# Patient Record
Sex: Female | Born: 1975
Health system: Southern US, Community
[De-identification: ages and names within clinical notes are randomized; demographics above are authoritative.]

## PROBLEM LIST (undated history)

## (undated) DIAGNOSIS — F32A Depression, unspecified: Secondary | ICD-10-CM

## (undated) DIAGNOSIS — R911 Solitary pulmonary nodule: Secondary | ICD-10-CM

## (undated) DIAGNOSIS — E669 Obesity, unspecified: Secondary | ICD-10-CM

## (undated) DIAGNOSIS — K635 Polyp of colon: Secondary | ICD-10-CM

## (undated) DIAGNOSIS — B9681 Helicobacter pylori [H. pylori] as the cause of diseases classified elsewhere: Secondary | ICD-10-CM

## (undated) DIAGNOSIS — K921 Melena: Secondary | ICD-10-CM

## (undated) DIAGNOSIS — K2970 Gastritis, unspecified, without bleeding: Secondary | ICD-10-CM

## (undated) DIAGNOSIS — R011 Cardiac murmur, unspecified: Secondary | ICD-10-CM

## (undated) DIAGNOSIS — F329 Major depressive disorder, single episode, unspecified: Secondary | ICD-10-CM

## (undated) DIAGNOSIS — R590 Localized enlarged lymph nodes: Secondary | ICD-10-CM

## (undated) DIAGNOSIS — K760 Fatty (change of) liver, not elsewhere classified: Secondary | ICD-10-CM

## (undated) DIAGNOSIS — D473 Essential (hemorrhagic) thrombocythemia: Secondary | ICD-10-CM

## (undated) DIAGNOSIS — D539 Nutritional anemia, unspecified: Secondary | ICD-10-CM

## (undated) DIAGNOSIS — F419 Anxiety disorder, unspecified: Secondary | ICD-10-CM

## (undated) DIAGNOSIS — M1990 Unspecified osteoarthritis, unspecified site: Secondary | ICD-10-CM

## (undated) DIAGNOSIS — E876 Hypokalemia: Principal | ICD-10-CM

## (undated) DIAGNOSIS — I10 Essential (primary) hypertension: Secondary | ICD-10-CM

## (undated) HISTORY — DX: Nutritional anemia, unspecified: D53.9

## (undated) HISTORY — PX: BREAST BIOPSY: SHX20

## (undated) HISTORY — DX: Anxiety disorder, unspecified: F41.9

## (undated) HISTORY — PX: REDUCTION MAMMAPLASTY: SUR839

## (undated) HISTORY — DX: Essential (primary) hypertension: I10

## (undated) HISTORY — DX: Melena: K92.1

## (undated) HISTORY — PX: KNEE ARTHROSCOPY: SUR90

## (undated) HISTORY — DX: Essential (hemorrhagic) thrombocythemia: D47.3

## (undated) HISTORY — DX: Fatty (change of) liver, not elsewhere classified: K76.0

## (undated) HISTORY — DX: Localized enlarged lymph nodes: R59.0

## (undated) HISTORY — DX: Cardiac murmur, unspecified: R01.1

## (undated) HISTORY — DX: Helicobacter pylori (H. pylori) as the cause of diseases classified elsewhere: B96.81

## (undated) HISTORY — DX: Polyp of colon: K63.5

## (undated) HISTORY — DX: Unspecified osteoarthritis, unspecified site: M19.90

## (undated) HISTORY — DX: Depression, unspecified: F32.A

## (undated) HISTORY — DX: Major depressive disorder, single episode, unspecified: F32.9

## (undated) HISTORY — DX: Solitary pulmonary nodule: R91.1

## (undated) HISTORY — DX: Gastritis, unspecified, without bleeding: K29.70

## (undated) HISTORY — DX: Hypokalemia: E87.6

## (undated) HISTORY — DX: Obesity, unspecified: E66.9

---

## 2002-03-27 ENCOUNTER — Encounter: Payer: Self-pay | Admitting: Emergency Medicine

## 2002-03-27 ENCOUNTER — Emergency Department (HOSPITAL_COMMUNITY): Admission: EM | Admit: 2002-03-27 | Discharge: 2002-03-27 | Payer: Self-pay | Admitting: Emergency Medicine

## 2008-10-19 HISTORY — PX: BREAST REDUCTION SURGERY: SHX8

## 2009-10-19 ENCOUNTER — Encounter (INDEPENDENT_AMBULATORY_CARE_PROVIDER_SITE_OTHER): Payer: Self-pay | Admitting: Plastic Surgery

## 2009-10-19 ENCOUNTER — Ambulatory Visit (HOSPITAL_BASED_OUTPATIENT_CLINIC_OR_DEPARTMENT_OTHER): Admission: RE | Admit: 2009-10-19 | Discharge: 2009-10-20 | Payer: Self-pay | Admitting: Plastic Surgery

## 2010-08-20 ENCOUNTER — Ambulatory Visit: Payer: Self-pay | Admitting: Diagnostic Radiology

## 2010-08-20 ENCOUNTER — Ambulatory Visit (HOSPITAL_BASED_OUTPATIENT_CLINIC_OR_DEPARTMENT_OTHER): Admission: RE | Admit: 2010-08-20 | Discharge: 2010-08-20 | Payer: Self-pay | Admitting: Orthopedic Surgery

## 2011-03-08 LAB — POCT HEMOGLOBIN-HEMACUE: Hemoglobin: 10.7 g/dL — ABNORMAL LOW (ref 12.0–15.0)

## 2012-02-24 HISTORY — PX: KNEE SURGERY: SHX244

## 2012-08-22 ENCOUNTER — Ambulatory Visit (INDEPENDENT_AMBULATORY_CARE_PROVIDER_SITE_OTHER): Payer: BC Managed Care – PPO | Admitting: Family Medicine

## 2012-08-22 ENCOUNTER — Encounter: Payer: Self-pay | Admitting: Family Medicine

## 2012-08-22 ENCOUNTER — Other Ambulatory Visit (HOSPITAL_COMMUNITY)
Admission: RE | Admit: 2012-08-22 | Discharge: 2012-08-22 | Disposition: A | Discharge status: Home / Self Care | Payer: BC Managed Care – PPO | Source: Ambulatory Visit | Attending: Family Medicine | Admitting: Family Medicine

## 2012-08-22 VITALS — BP 130/84 | HR 85 | Temp 98.4°F | Ht 64.5 in | Wt 225.2 lb

## 2012-08-22 DIAGNOSIS — Z0000 Encounter for general adult medical examination without abnormal findings: Secondary | ICD-10-CM | POA: Insufficient documentation

## 2012-08-22 DIAGNOSIS — Z01419 Encounter for gynecological examination (general) (routine) without abnormal findings: Secondary | ICD-10-CM | POA: Insufficient documentation

## 2012-08-22 DIAGNOSIS — Z124 Encounter for screening for malignant neoplasm of cervix: Secondary | ICD-10-CM | POA: Insufficient documentation

## 2012-08-22 DIAGNOSIS — K625 Hemorrhage of anus and rectum: Secondary | ICD-10-CM | POA: Insufficient documentation

## 2012-08-22 LAB — CBC WITH DIFFERENTIAL/PLATELET
Basophils Absolute: 0 10*3/uL (ref 0.0–0.1)
Basophils Relative: 0.3 % (ref 0.0–3.0)
Eosinophils Absolute: 0.1 10*3/uL (ref 0.0–0.7)
Eosinophils Relative: 1 % (ref 0.0–5.0)
HCT: 37 % (ref 36.0–46.0)
Hemoglobin: 11.7 g/dL — ABNORMAL LOW (ref 12.0–15.0)
Lymphocytes Relative: 20.4 % (ref 12.0–46.0)
Lymphs Abs: 1.9 10*3/uL (ref 0.7–4.0)
MCHC: 31.5 g/dL (ref 30.0–36.0)
MCV: 88.3 fl (ref 78.0–100.0)
Monocytes Absolute: 0.7 10*3/uL (ref 0.1–1.0)
Monocytes Relative: 7.6 % (ref 3.0–12.0)
Neutro Abs: 6.4 10*3/uL (ref 1.4–7.7)
Neutrophils Relative %: 70.7 % (ref 43.0–77.0)
Platelets: 398 10*3/uL (ref 150.0–400.0)
RBC: 4.19 Mil/uL (ref 3.87–5.11)
RDW: 12.4 % (ref 11.5–14.6)
WBC: 9.1 10*3/uL (ref 4.5–10.5)

## 2012-08-22 LAB — BASIC METABOLIC PANEL
BUN: 15 mg/dL (ref 6–23)
CO2: 26 mEq/L (ref 19–32)
Calcium: 8.9 mg/dL (ref 8.4–10.5)
Chloride: 104 mEq/L (ref 96–112)
Creatinine, Ser: 1.1 mg/dL (ref 0.4–1.2)
GFR: 74.66 mL/min (ref >60.00)
Glucose, Bld: 94 mg/dL (ref 70–99)
Potassium: 3.4 mEq/L — ABNORMAL LOW (ref 3.5–5.1)
Sodium: 137 mEq/L (ref 135–145)

## 2012-08-22 LAB — HEPATIC FUNCTION PANEL
ALT: 44 U/L — ABNORMAL HIGH (ref 0–35)
AST: 34 U/L (ref 0–37)
Albumin: 3.9 g/dL (ref 3.5–5.2)
Alkaline Phosphatase: 92 U/L (ref 39–117)
Bilirubin, Direct: 0 mg/dL (ref 0.0–0.3)
Total Bilirubin: 0.7 mg/dL (ref 0.3–1.2)
Total Protein: 7.7 g/dL (ref 6.0–8.3)

## 2012-08-22 LAB — LIPID PANEL
Total CHOL/HDL Ratio: 2
VLDL: 21 mg/dL (ref 0.0–40.0)

## 2012-08-22 LAB — TSH: TSH: 1.14 u[IU]/mL (ref 0.35–5.50)

## 2012-08-22 NOTE — Patient Instructions (Addendum)
We'll notify you of your lab results and make any changes if needed We'll call you with your GI appt to assess your rectal bleeding- it's most likely a diverticular bleed Try and make healthy food choices and get regular exercise Call with any questions or concerns Welcome!  We're glad to have you! Happy Early [place] Ouch!!

## 2012-08-22 NOTE — Progress Notes (Signed)
  Subjective:    Patient ID: Pamela Clarke, female    DOB: 01-15-1976, 36 y.o.   MRN: [id number]  HPI New to establish.  Previous MD- Raymondo Band, last seen 3-4 yrs ago.  CPE- no concerns today, no medical problems.   Review of Systems Patient reports no vision/ hearing changes, adenopathy, fever, weight change,  persistant/recurrent hoarseness , swallowing issues, chest pain, palpitations, edema, persistant/recurrent cough, hemoptysis, dyspnea (rest/exertional/paroxysmal nocturnal), abdominal pain, significant heartburn, bowel changes, GU symptoms (dysuria, hematuria, incontinence), Gyn symptoms (abnormal  bleeding, pain),  syncope, focal weakness, memory loss, numbness & tingling, skin/hair/nail changes, abnormal bruising or bleeding, anxiety, or depression.   2 episodes recently of BRBPR- fills the bowl after BM.  Both episodes occurred in last month.  No hx of previous.  Denies recent constipation.  No family hx of colon cancer.    Objective:   Physical Exam  General Appearance:    Alert, cooperative, no distress, appears stated age  Head:    Normocephalic, without obvious abnormality, atraumatic  Eyes:    PERRL, conjunctiva/corneas clear, EOM's intact, fundi    benign, both eyes  Ears:    Normal TM's and external ear canals, both ears  Nose:   Nares normal, septum midline, mucosa normal, no drainage    or sinus tenderness  Throat:   Lips, mucosa, and tongue normal; teeth and gums normal  Neck:   Supple, symmetrical, trachea midline, no adenopathy;    Thyroid: no enlargement/tenderness/nodules  Back:     Symmetric, no curvature, ROM normal, no CVA tenderness  Lungs:     Clear to auscultation bilaterally, respirations unlabored  Chest Wall:    No tenderness or deformity   Heart:    Regular rate and rhythm, S1 and S2 normal, no murmur, rub   or gallop  Breast Exam:    No tenderness, masses, or nipple abnormality  Abdomen:     Soft, non-tender, bowel sounds active all four quadrants,     no masses, no organomegaly  Genitalia:    External genitalia normal, cervix normal in appearance, no CMT, uterus in normal size and position, adnexa w/out mass or tenderness, mucosa pink and moist, no lesions or discharge present  Rectal:    Normal external appearance  Extremities:   Extremities normal, atraumatic, no cyanosis or edema  Pulses:   2+ and symmetric all extremities  Skin:   Skin color, texture, turgor normal, no rashes or lesions  Lymph nodes:   Cervical, supraclavicular, and axillary nodes normal  Neurologic:   CNII-XII intact, normal strength, sensation and reflexes    throughout          Assessment & Plan:

## 2012-08-23 ENCOUNTER — Encounter: Payer: Self-pay | Admitting: Internal Medicine

## 2012-08-25 LAB — VITAMIN D 1,25 DIHYDROXY
Vitamin D 1, 25 (OH)2 Total: 39 pg/mL (ref 18–72)
Vitamin D3 1, 25 (OH)2: 39 pg/mL

## 2012-08-26 ENCOUNTER — Encounter: Payer: Self-pay | Admitting: *Deleted

## 2012-08-27 NOTE — Assessment & Plan Note (Signed)
Pap collected. 

## 2012-08-27 NOTE — Assessment & Plan Note (Signed)
New.  Most likely a diverticular bleed or anal fissure but will refer to GI for complete evaluation.

## 2012-08-27 NOTE — Assessment & Plan Note (Signed)
Pt's PE WNL.  Check labs.  Anticipatory guidance provided.  

## 2012-09-03 ENCOUNTER — Encounter: Payer: Self-pay | Admitting: Internal Medicine

## 2012-09-03 ENCOUNTER — Other Ambulatory Visit (INDEPENDENT_AMBULATORY_CARE_PROVIDER_SITE_OTHER): Payer: BC Managed Care – PPO

## 2012-09-03 ENCOUNTER — Ambulatory Visit (INDEPENDENT_AMBULATORY_CARE_PROVIDER_SITE_OTHER): Payer: BC Managed Care – PPO | Admitting: Internal Medicine

## 2012-09-03 VITALS — BP 160/98 | HR 72 | Ht 63.5 in | Wt 228.2 lb

## 2012-09-03 DIAGNOSIS — K625 Hemorrhage of anus and rectum: Secondary | ICD-10-CM

## 2012-09-03 DIAGNOSIS — R7989 Other specified abnormal findings of blood chemistry: Secondary | ICD-10-CM

## 2012-09-03 DIAGNOSIS — R945 Abnormal results of liver function studies: Secondary | ICD-10-CM

## 2012-09-03 LAB — FERRITIN: Ferritin: 49.1 ng/mL (ref 10.0–291.0)

## 2012-09-03 LAB — HEPATITIS C ANTIBODY: HCV Ab: NEGATIVE

## 2012-09-03 MED ORDER — MOVIPREP 100 G PO SOLR: 1.0000 | Freq: Once | ORAL | Status: DC

## 2012-09-03 NOTE — Progress Notes (Signed)
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 She has been having painless, low-volume rectal bleeding for at least a year. The blood is on the toilet tissue as well as in the commode. She has chronic anemia. Her hemoglobin has been as low as 10.7, most recently it was 11.7 with an MCV of 88. There is no family history of colon cancer. Her bowel habits are regular, 1-2 bowel movements a day. Another issue has been mild abnormalities of liver function tests with an AST of 34 and ALT of 44. She was told to have abnormal liver tests while in college 15 years ago. She was never tested for liver disease. There is no family history of liver disease. She does not drink alcohol. She played college basketball and had right knee injuries resulting from it necessitating recent arthroscopy. She denies any abdominal pain, nausea, vomiting or family history of gallbladder disease.   Past Medical History  Diagnosis Date  . Arthritis   . Blood in stool   . Heart murmur   . Obesity    Past Surgical History  Procedure Date  . Knee surgery 3 23 13     right  . Knee arthroscopy     left  . Breast reduction surgery 10/19/08    reports that she has never smoked. She has never used smokeless tobacco. She reports that she drinks alcohol. She reports that she does not use illicit drugs. family history includes Diabetes in her father; Heart disease in her maternal grandmother and paternal grandfather; Hypertension in her father and maternal grandmother; Ovarian cancer in her paternal grandmother; and Stroke in her father, maternal grandmother, and paternal grandfather.  There is no history of Colon cancer. No Known Allergies      Review of Systems: Denies heartburn shortness of breath chest pain. Positive for weight gain  The remainder of the 10 point ROS is negative except as outlined in H&P   Physical  Exam: General appearance  Well developed, in no distress. Eyes- non icteric. HEENT nontraumatic, normocephalic. Mouth no lesions, tongue papillated, no cheilosis. Neck supple without adenopathy, thyroid not enlarged, no carotid bruits, no JVD. Lungs Clear to auscultation bilaterally. Cor normal S1, normal S2, regular rhythm, no murmur,  quiet precordium. Abdomen: Soft with mild tenderness in epigastrium. Mild tenderness in left lower quadrant. No distention. Normal active bowel sounds. Liver edge at costal margin. Nontender not enlarged. Rectal: Soft Hemoccult negative stool. No external hemorrhoids no pain on digital exam. Extremities trace pedal edema. Skin no lesions. No stigmata of chronic liver disease. Neurological alert and oriented x 3. No asterixis Psychological normal mood and affect.  Assessment and Plan:  Problem #22 36 year old [place] American female with painless low-volume rectal bleeding of prolonged duration. She is concerned about the etiology of it. I suspect anorectal or left colon source because of the bright red blood. She has mild chronic anemia. We will proceed with a colonoscopy to rule out a left colon lesion. I have discussed the prep, sedation and the procedure with the patient.  Problem #2 Abnormal liver enzymes date back to 15 years ago. We need to rule out chronic low-grade liver disease. We will obtain multiple tests pertaining to the etiology of liver disease such as hepatitis serologies, AMA and ANA titer. PT. Anti SM antibody,,ferritin.. We will at the same time schedule an ultrasound of the liver and gallbladder.   09/03/2012  Lina Sar

## 2012-09-03 NOTE — Patient Instructions (Addendum)
You have been scheduled for a colonoscopy with propofol. Please follow written instructions given to you at your visit today.  Please pick up your prep kit at the pharmacy within the next 1-3 days. If you use inhalers (even only as needed), please bring them with you on the day of your procedure. You have been scheduled for an abdominal ultrasound at [facility] (1st floor of hospital) on Friday, 09/06/12 at 9:00 am. Please arrive 15 minutes prior to your appointment for registration. Make certain not to have anything to eat or drink 6 hours prior to your appointment. Should you need to reschedule your appointment, please contact radiology at [phone]. Your physician has requested that you go to the basement for the following lab work before leaving today: Hepatitis Panel CC: Dr Beverely Low

## 2012-09-04 LAB — ALPHA-1-ANTITRYPSIN: A-1 Antitrypsin, Ser: 136 mg/dL (ref 90–200)

## 2012-09-04 LAB — ANA: Anti Nuclear Antibody(ANA): NEGATIVE

## 2012-09-05 ENCOUNTER — Encounter: Payer: Self-pay | Admitting: *Deleted

## 2012-09-05 ENCOUNTER — Other Ambulatory Visit (INDEPENDENT_AMBULATORY_CARE_PROVIDER_SITE_OTHER): Payer: BC Managed Care – PPO

## 2012-09-05 DIAGNOSIS — R7401 Elevation of levels of liver transaminase levels: Secondary | ICD-10-CM

## 2012-09-05 DIAGNOSIS — R748 Abnormal levels of other serum enzymes: Secondary | ICD-10-CM

## 2012-09-05 DIAGNOSIS — R7402 Elevation of levels of lactic acid dehydrogenase (LDH): Secondary | ICD-10-CM

## 2012-09-05 LAB — ANTI-SMOOTH MUSCLE ANTIBODY, IGG: Smooth Muscle Ab: 7 U (ref <20)

## 2012-09-05 LAB — HEPATIC FUNCTION PANEL
ALT: 22 U/L (ref 0–35)
AST: 25 U/L (ref 0–37)
Bilirubin, Direct: 0 mg/dL (ref 0.0–0.3)
Total Protein: 7.3 g/dL (ref 6.0–8.3)

## 2012-09-05 LAB — MITOCHONDRIAL ANTIBODIES: Mitochondrial M2 Ab, IgG: 0.51 (ref <0.91)

## 2012-09-06 ENCOUNTER — Ambulatory Visit (HOSPITAL_COMMUNITY)
Admission: RE | Admit: 2012-09-06 | Discharge: 2012-09-06 | Disposition: A | Discharge status: Home / Self Care | Payer: BC Managed Care – PPO | Source: Ambulatory Visit | Attending: Internal Medicine | Admitting: Internal Medicine

## 2012-09-06 ENCOUNTER — Other Ambulatory Visit: Payer: Self-pay | Admitting: *Deleted

## 2012-09-06 DIAGNOSIS — K760 Fatty (change of) liver, not elsewhere classified: Secondary | ICD-10-CM

## 2012-09-06 DIAGNOSIS — R945 Abnormal results of liver function studies: Secondary | ICD-10-CM

## 2012-09-06 DIAGNOSIS — R7989 Other specified abnormal findings of blood chemistry: Secondary | ICD-10-CM | POA: Insufficient documentation

## 2012-10-22 ENCOUNTER — Encounter: Payer: Self-pay | Admitting: Internal Medicine

## 2012-10-22 ENCOUNTER — Ambulatory Visit (AMBULATORY_SURGERY_CENTER): Payer: BC Managed Care – PPO | Admitting: Internal Medicine

## 2012-10-22 VITALS — BP 159/91 | HR 61 | Temp 98.4°F | Resp 17 | Ht 63.5 in | Wt 228.0 lb

## 2012-10-22 DIAGNOSIS — K625 Hemorrhage of anus and rectum: Secondary | ICD-10-CM

## 2012-10-22 DIAGNOSIS — D126 Benign neoplasm of colon, unspecified: Secondary | ICD-10-CM

## 2012-10-22 DIAGNOSIS — K635 Polyp of colon: Secondary | ICD-10-CM

## 2012-10-22 DIAGNOSIS — R7989 Other specified abnormal findings of blood chemistry: Secondary | ICD-10-CM

## 2012-10-22 HISTORY — DX: Polyp of colon: K63.5

## 2012-10-22 MED ORDER — HYDROCORTISONE ACETATE 25 MG RE SUPP: 25.0000 mg | Freq: Two times a day (BID) | RECTAL | Status: DC

## 2012-10-22 MED ORDER — SODIUM CHLORIDE 0.9 % IV SOLN: 500.0000 mL | INTRAVENOUS | Status: DC

## 2012-10-22 NOTE — Op Note (Signed)
[facility] [address] [place], [address]   COLONOSCOPY PROCEDURE REPORT  PATIENT: Pamela, Clarke  MR#: [id number] BIRTHDATE: [birth year] , 36  yrs. old GENDER: Female ENDOSCOPIST: Hart Carwin, MD REFERRED BY:  Sheliah Hatch, M.D. PROCEDURE DATE:  10/22/2012 PROCEDURE:   Colonoscopy with cold biopsy polypectomy ASA CLASS:   Class I INDICATIONS:hematochezia. MEDICATIONS: MAC sedation, administered by CRNA and propofol (Diprivan) 350mg  IV  DESCRIPTION OF PROCEDURE:   After the risks and benefits and of the procedure were explained, informed consent was obtained.  A digital rectal exam revealed no abnormalities of the rectum.    The LB CF-[id number]  endoscope was introduced through the anus and advanced to the cecum, which was identified by both the appendix and ileocecal valve .  The quality of the prep was good, using MoviPrep .  The instrument was then slowly withdrawn as the colon was fully examined.     COLON FINDINGS: A smooth sessile polyp ranging between 3-49mm in size was found in the sigmoid colon.  A polypectomy was performed with cold forceps.  The resection was complete and the polyp tissue was completely retrieved.     Retroflexed views revealed no abnormalities.     The scope was then withdrawn from the patient and the procedure completed.  COMPLICATIONS: There were no complications. ENDOSCOPIC IMPRESSION: Sessile polyp ranging between 3-50mm in size was found in the sigmoid colon; polypectomy was performed with cold forceps anorectal source of bleeding, no significant hemiorrhoids   RECOMMENDATIONS: 1.  Await pathology results 2.  High fiber diet   REPEAT EXAM: In 10 year(s)  for Colonoscopy.  cc:  _______________________________ eSignedHart Carwin, MD 10/22/2012 4:10 PM     PATIENT NAME:  Pamela Clarke, Pamela Clarke MR#: [id number]

## 2012-10-22 NOTE — Progress Notes (Addendum)
Patient did not have preoperative order for IV antibiotic SSI prophylaxis. (G8918)  Patient did not experience any of the following events: a burn prior to discharge; a fall within the facility; wrong site/side/patient/procedure/implant event; or a hospital transfer or hospital admission upon discharge from the facility. (G8907)  

## 2012-10-22 NOTE — Patient Instructions (Addendum)
YOU HAD AN ENDOSCOPIC PROCEDURE TODAY AT THE [facility]: Refer to the procedure report that was given to you for any specific questions about what was found during the examination.  If the procedure report does not answer your questions, please call your gastroenterologist to clarify.  If you requested that your care partner not be given the details of your procedure findings, then the procedure report has been included in a sealed envelope for you to review at your convenience later.  YOU SHOULD EXPECT: Some feelings of bloating in the abdomen. Passage of more gas than usual.  Walking can help get rid of the air that was put into your GI tract during the procedure and reduce the bloating. If you had a lower endoscopy (such as a colonoscopy or flexible sigmoidoscopy) you may notice spotting of blood in your stool or on the toilet paper. If you underwent a bowel prep for your procedure, then you may not have a normal bowel movement for a few days.  DIET: Your first meal following the procedure should be a light meal and then it is ok to progress to your normal diet.  A half-sandwich or bowl of soup is an example of a good first meal.  Heavy or fried foods are harder to digest and may make you feel nauseous or bloated.  Likewise meals heavy in dairy and vegetables can cause extra gas to form and this can also increase the bloating.  Drink plenty of fluids but you should avoid alcoholic beverages for 24 hours.  ACTIVITY: Your care partner should take you home directly after the procedure.  You should plan to take it easy, moving slowly for the rest of the day.  You can resume normal activity the day after the procedure however you should NOT DRIVE or use heavy machinery for 24 hours (because of the sedation medicines used during the test).    SYMPTOMS TO REPORT IMMEDIATELY: A gastroenterologist can be reached at any hour.  During normal business hours, 8:30 AM to 5:00 PM Monday through Friday,  call [phone].  After hours and on weekends, please call the GI answering service at [phone] who will take a message and have the physician on call contact you.   Following lower endoscopy (colonoscopy or flexible sigmoidoscopy):  Excessive amounts of blood in the stool  Significant tenderness or worsening of abdominal pains  Swelling of the abdomen that is new, acute  Fever of 100F or higher  FOLLOW UP: If any biopsies were taken you will be contacted by phone or by letter within the next 1-3 weeks.  Call your gastroenterologist if you have not heard about the biopsies in 3 weeks.  Our staff will call the home number listed on your records the next business day following your procedure to check on you and address any questions or concerns that you may have at that time regarding the information given to you following your procedure. This is a courtesy call and so if there is no answer at the home number and we have not heard from you through the emergency physician on call, we will assume that you have returned to your regular daily activities without incident.  SIGNATURES/CONFIDENTIALITY: You and/or your care partner have signed paperwork which will be entered into your electronic medical record.  These signatures attest to the fact that that the information above on your After Visit Summary has been reviewed and is understood.  Full responsibility of the confidentiality of this   discharge information lies with you and/or your care-partner.   Thank-you for choosing us for your healthcare needs. 

## 2012-10-22 NOTE — Progress Notes (Signed)
No egg or soy allergy per pt. ewm 

## 2012-10-23 ENCOUNTER — Telehealth: Payer: Self-pay

## 2012-10-23 NOTE — Telephone Encounter (Signed)
  Follow up Call-  Call back number 10/22/2012  Post procedure Call Back phone  # [phone]  Permission to leave phone message Yes     Patient questions:  Do you have a fever, pain , or abdominal swelling? no Pain Score  0 *  Have you tolerated food without any problems? yes  Have you been able to return to your normal activities? yes  Do you have any questions about your discharge instructions: Diet   no Medications  no Follow up visit  no  Do you have questions or concerns about your Care? no  Actions: * If pain score is 4 or above: No action needed, pain <4.

## 2012-10-29 ENCOUNTER — Encounter: Payer: Self-pay | Admitting: Internal Medicine

## 2012-11-05 ENCOUNTER — Telehealth: Payer: Self-pay | Admitting: *Deleted

## 2012-11-05 ENCOUNTER — Other Ambulatory Visit: Payer: Self-pay | Admitting: *Deleted

## 2012-11-05 DIAGNOSIS — R945 Abnormal results of liver function studies: Secondary | ICD-10-CM

## 2012-11-05 NOTE — Telephone Encounter (Signed)
Spoke with patient and scheduled OV on 12/14/11 at 2:15 PM. Patient will come prior to OV for LFT's to be drawn.

## 2012-11-05 NOTE — Telephone Encounter (Signed)
Left a message for patient to call me. 

## 2012-11-05 NOTE — Telephone Encounter (Signed)
Message copied by Daphine Deutscher on Tue Nov 05, 2012  9:58 AM ------      Message from: Daphine Deutscher      Created: Fri Sep 06, 2012  3:28 PM       Call and schedule OV and LFT around 1/6/14DB

## 2012-11-11 ENCOUNTER — Encounter: Payer: Self-pay | Admitting: *Deleted

## 2012-12-01 ENCOUNTER — Emergency Department (HOSPITAL_BASED_OUTPATIENT_CLINIC_OR_DEPARTMENT_OTHER): Payer: BC Managed Care – PPO

## 2012-12-01 ENCOUNTER — Emergency Department (HOSPITAL_BASED_OUTPATIENT_CLINIC_OR_DEPARTMENT_OTHER)
Admission: EM | Admit: 2012-12-01 | Discharge: 2012-12-01 | Disposition: A | Discharge status: Home / Self Care | Payer: BC Managed Care – PPO | Attending: Emergency Medicine | Admitting: Emergency Medicine

## 2012-12-01 ENCOUNTER — Encounter (HOSPITAL_BASED_OUTPATIENT_CLINIC_OR_DEPARTMENT_OTHER): Payer: Self-pay | Admitting: *Deleted

## 2012-12-01 DIAGNOSIS — Z79899 Other long term (current) drug therapy: Secondary | ICD-10-CM | POA: Insufficient documentation

## 2012-12-01 DIAGNOSIS — Z791 Long term (current) use of non-steroidal anti-inflammatories (NSAID): Secondary | ICD-10-CM | POA: Insufficient documentation

## 2012-12-01 DIAGNOSIS — Z3202 Encounter for pregnancy test, result negative: Secondary | ICD-10-CM | POA: Insufficient documentation

## 2012-12-01 DIAGNOSIS — Z8719 Personal history of other diseases of the digestive system: Secondary | ICD-10-CM | POA: Insufficient documentation

## 2012-12-01 DIAGNOSIS — I10 Essential (primary) hypertension: Secondary | ICD-10-CM | POA: Insufficient documentation

## 2012-12-01 DIAGNOSIS — M129 Arthropathy, unspecified: Secondary | ICD-10-CM | POA: Insufficient documentation

## 2012-12-01 DIAGNOSIS — G43909 Migraine, unspecified, not intractable, without status migrainosus: Secondary | ICD-10-CM | POA: Insufficient documentation

## 2012-12-01 DIAGNOSIS — R011 Cardiac murmur, unspecified: Secondary | ICD-10-CM | POA: Insufficient documentation

## 2012-12-01 DIAGNOSIS — E669 Obesity, unspecified: Secondary | ICD-10-CM | POA: Insufficient documentation

## 2012-12-01 LAB — URINALYSIS, ROUTINE W REFLEX MICROSCOPIC
Glucose, UA: NEGATIVE mg/dL
Hgb urine dipstick: NEGATIVE
Ketones, ur: NEGATIVE mg/dL
Protein, ur: NEGATIVE mg/dL
pH: 7.5 (ref 5.0–8.0)

## 2012-12-01 MED ORDER — SODIUM CHLORIDE 0.9 % IV BOLUS (SEPSIS)
500.0000 mL | Freq: Once | INTRAVENOUS | Status: AC
  Administered 2012-12-01: 500 mL via INTRAVENOUS

## 2012-12-01 MED ORDER — LISINOPRIL 20 MG PO TABS: 10.0000 mg | ORAL_TABLET | Freq: Every day | ORAL | Status: DC

## 2012-12-01 MED ORDER — KETOROLAC TROMETHAMINE 30 MG/ML IJ SOLN
30.0000 mg | Freq: Once | INTRAMUSCULAR | Status: AC
  Administered 2012-12-01: 30 mg via INTRAVENOUS
  Filled 2012-12-01: qty 1

## 2012-12-01 MED ORDER — DIPHENHYDRAMINE HCL 50 MG/ML IJ SOLN
25.0000 mg | Freq: Once | INTRAMUSCULAR | Status: AC
  Administered 2012-12-01: 25 mg via INTRAVENOUS
  Filled 2012-12-01: qty 1

## 2012-12-01 MED ORDER — LISINOPRIL 10 MG PO TABS
10.0000 mg | ORAL_TABLET | Freq: Once | ORAL | Status: AC
  Administered 2012-12-01: 10 mg via ORAL
  Filled 2012-12-01: qty 1

## 2012-12-01 MED ORDER — METOCLOPRAMIDE HCL 5 MG/ML IJ SOLN
10.0000 mg | Freq: Once | INTRAMUSCULAR | Status: AC
  Administered 2012-12-01: 10 mg via INTRAVENOUS
  Filled 2012-12-01: qty 2

## 2012-12-01 MED ORDER — TOPIRAMATE 25 MG PO TABS: 25.0000 mg | ORAL_TABLET | Freq: Two times a day (BID) | ORAL | Status: DC

## 2012-12-01 NOTE — ED Provider Notes (Signed)
History     CSN: [id number]  Arrival date & time 12/01/12  1326   First MD Initiated Contact with Patient 12/01/12 1405      Chief Complaint  Patient presents with  . Headache    (Consider location/radiation/quality/duration/timing/severity/associated sxs/prior treatment) Patient is a 36 y.o. female presenting with headaches. The history is provided by the patient. No language interpreter was used.  Headache  This is a recurrent problem. Pertinent negatives include no shortness of breath.   36 year old female coming in with a headache x4 days behind her left eye. States she's also had hypertension past week. Past medical history hypertension noncompliant, migraines, obesity, murmur, blood in her stool. Patient states that her pressure was 216/113 and 159/109 in the past couple days. States she has a remote history of hypertension and was on Maxalt 7 years ago. Also has a past medical history of a workup for possible aneurysm in the past. She's not sure what that she had but she thinks they were negative. Patient was on Topamax in the past for her migraines. States she's had some nausea as well and blurred vision. No photophobia or phonophobia. No weakness.  Past Medical History  Diagnosis Date  . Arthritis   . Blood in stool   . Heart murmur   . Obesity   . Hypertension     history of this, no meds  . Fatty liver   . Hyperplastic colon polyp 10/22/12    Past Surgical History  Procedure Date  . Knee surgery 3 23 13     right  . Knee arthroscopy     left  . Breast reduction surgery 10/19/08    Family History  Problem Relation Age of Onset  . Stroke Father   . Hypertension Father   . Diabetes Father   . Hypertension Maternal Grandmother   . Stroke Maternal Grandmother   . Ovarian cancer Paternal Grandmother   . Colon cancer Neg Hx   . Heart disease Paternal Grandfather   . Heart disease Maternal Grandmother   . Stroke Paternal Grandfather     History  Substance  Use Topics  . Smoking status: Never Smoker   . Smokeless tobacco: Never Used  . Alcohol Use: Yes     Comment: occasionally wine 1-2 glasses q2 weeks    OB History    Grav Para Term Preterm Abortions TAB SAB Ect Mult Living                  Review of Systems  Constitutional: Negative.   Eyes: Negative.   Respiratory: Negative.  Negative for shortness of breath.   Cardiovascular: Negative.   Gastrointestinal: Negative.   Neurological: Positive for headaches.  Psychiatric/Behavioral: Negative.   All other systems reviewed and are negative.    Allergies  Review of patient's allergies indicates no known allergies.  Home Medications   Current Outpatient Rx  Name  Route  Sig  Dispense  Refill  . HYDROCODONE-ACETAMINOPHEN 5-325 MG PO TABS      1-2 tablets every 4-6 hours as needed         . HYDROCORTISONE ACETATE 25 MG RE SUPP   Rectal   Place 1 suppository (25 mg total) rectally 2 (two) times daily.   12 suppository   3   . IBUPROFEN 200 MG PO CAPS   Oral   Take by mouth as needed.         . MULTIVITAMIN PO   Oral   Take 1 capsule by  mouth daily.         Marland Kitchen NAPROXEN SODIUM 220 MG PO CAPS   Oral   Take by mouth as needed.           BP 141/90  Pulse 86  Temp 98.5 F (36.9 C) (Oral)  Resp 18  Ht 5\' 3"  (1.6 m)  Wt 220 lb (99.791 kg)  BMI 38.97 kg/m2  SpO2 100%  LMP 11/24/2012  Physical Exam  Nursing note and vitals reviewed. Constitutional: She is oriented to person, place, and time. She appears well-developed and well-nourished.  HENT:  Head: Normocephalic and atraumatic.  Eyes: Conjunctivae normal and EOM are normal. Pupils are equal, round, and reactive to light.  Neck: Normal range of motion. Neck supple.  Cardiovascular: Normal rate.   Pulmonary/Chest: Effort normal.  Abdominal: Soft.  Musculoskeletal: Normal range of motion. She exhibits no edema and no tenderness.  Neurological: She is alert and oriented to person, place, and time. She  has normal strength and normal reflexes. No cranial nerve deficit or sensory deficit. She displays a negative Romberg sign. GCS eye subscore is 4. GCS verbal subscore is 5. GCS motor subscore is 6.  Skin: Skin is warm and dry.  Psychiatric: She has a normal mood and affect.    ED Course  Procedures (including critical care time)   Labs Reviewed  GLUCOSE, CAPILLARY  URINALYSIS, ROUTINE W REFLEX MICROSCOPIC  PREGNANCY, URINE   Ct Head Wo Contrast  12/01/2012  *RADIOLOGY REPORT*  Clinical Data: Patient with increased blurred vision  CT HEAD WITHOUT CONTRAST  Technique:  Contiguous axial images were obtained from the base of the skull through the vertex without contrast.  Comparison: None.  Findings: No acute intracranial hemorrhage.  No focal mass lesion. No CT evidence of acute infarction.   No midline shift or mass effect.  No hydrocephalus.  Basilar cisterns are patent. Paranasal sinuses and mastoid air cells are clear.  Orbits are normal.  IMPRESSION: Normal head CT   Original Report Authenticated By: Genevive Bi, M.D.      No diagnosis found.    MDM  36 year old female with migraine headache today and hypertension. CT of the head with no acute findings. Treated with a migraine cocktail with relief. Hypertension was treated with lisinopril in the ER. We will restart her hypertension medication and give her a prescription for Topamax which she used for her migraines in the past. She will followup with her primary care physician in [place] this week.   Date: 12/01/2012  Rate: 89  Rhythm: normal sinus rhythm  QRS Axis: normal  Intervals: normal  ST/T Wave abnormalities: normal  Conduction Disutrbances:none  Narrative Interpretation:   Old EKG Reviewed: none available   Labs Reviewed  GLUCOSE, CAPILLARY  URINALYSIS, ROUTINE W REFLEX MICROSCOPIC  PREGNANCY, URINE       Remi Haggard, NP 12/01/12 1606  Remi Haggard, NP 12/01/12 1711

## 2012-12-01 NOTE — ED Notes (Signed)
Headache since 24th. Also c/o blurred vision in left eye. Does not have hx, but states that BP has been up the last few times it's been checked. EKG done at triage. FSBS 77

## 2012-12-02 ENCOUNTER — Encounter: Payer: Self-pay | Admitting: Family Medicine

## 2012-12-02 ENCOUNTER — Ambulatory Visit (INDEPENDENT_AMBULATORY_CARE_PROVIDER_SITE_OTHER): Payer: BC Managed Care – PPO | Admitting: Family Medicine

## 2012-12-02 VITALS — BP 170/100 | HR 92 | Temp 98.3°F | Ht 64.5 in | Wt 231.2 lb

## 2012-12-02 DIAGNOSIS — I10 Essential (primary) hypertension: Secondary | ICD-10-CM | POA: Insufficient documentation

## 2012-12-02 MED ORDER — HYDROCHLOROTHIAZIDE 12.5 MG PO TABS: 12.5000 mg | ORAL_TABLET | Freq: Every day | ORAL | Status: DC

## 2012-12-02 NOTE — Assessment & Plan Note (Signed)
New dx.  Pt started on Lisinopril 10mg  yesterday in ER.  Will continue 10mg  and add 12.5mg  HCTZ.  Check BMP as this was not done in ER yesterday.  Reviewed importance of low salt diet, regular exercise.  Will follow closely.

## 2012-12-02 NOTE — Patient Instructions (Addendum)
Follow up in 3 weeks to recheck BP Continue the Lisinopril 1/2 tab daily Add the hydrochlorothiazide once daily DO NOT check BP more than 2x/day Limit your salt intake Find a stress outlet Regular exercise! Call with any questions or concerns Happy New Year!!! You can totally do this!!

## 2012-12-02 NOTE — Progress Notes (Signed)
  Subjective:    Patient ID: Pamela Clarke, female    DOB: 10-28-1976, 36 y.o.   MRN: [id number]  HPI HTN- pt reports she started checking BP on Friday due to HA.  Thinks BP has been elevated longer than this- was high at time of colonoscopy in October.  + family hx of HTN.  Went to ER yesterday w/ severe HA and elevated BP.  'it was high all day yesterday'.  Was started on 1/2 tab of lisinopril 20 (10 mg) yesterday.  Took BP med at 9 am today.  No CP, SOB, visual changes.  + edema R foot- pt thought this was due to knee surgery.   Review of Systems For ROS see HPI     Objective:   Physical Exam  Vitals reviewed. Constitutional: She is oriented to person, place, and time. She appears well-developed and well-nourished. No distress.  HENT:  Head: Normocephalic and atraumatic.  Eyes: Conjunctivae normal and EOM are normal. Pupils are equal, round, and reactive to light.  Neck: Normal range of motion. Neck supple. No thyromegaly present.  Cardiovascular: Normal rate, regular rhythm, normal heart sounds and intact distal pulses.   No murmur heard. Pulmonary/Chest: Effort normal and breath sounds normal. No respiratory distress.  Abdominal: Soft. She exhibits no distension. There is no tenderness.  Musculoskeletal: She exhibits no edema.  Lymphadenopathy:    She has no cervical adenopathy.  Neurological: She is alert and oriented to person, place, and time.  Skin: Skin is warm and dry.  Psychiatric: She has a normal mood and affect. Her behavior is normal.          Assessment & Plan:

## 2012-12-03 ENCOUNTER — Ambulatory Visit: Payer: BC Managed Care – PPO | Admitting: Family Medicine

## 2012-12-03 LAB — BASIC METABOLIC PANEL
BUN: 13 mg/dL (ref 6–23)
CO2: 25 mEq/L (ref 19–32)
Chloride: 106 mEq/L (ref 96–112)
Creatinine, Ser: 1.1 mg/dL (ref 0.4–1.2)
Potassium: 4 mEq/L (ref 3.5–5.1)

## 2012-12-03 NOTE — ED Provider Notes (Signed)
Medical screening examination/treatment/procedure(s) were performed by non-physician practitioner and as supervising physician I was immediately available for consultation/collaboration.  Gilford Lardizabal, MD 12/03/12 0945 

## 2012-12-13 ENCOUNTER — Encounter: Payer: Self-pay | Admitting: Internal Medicine

## 2012-12-13 ENCOUNTER — Ambulatory Visit (INDEPENDENT_AMBULATORY_CARE_PROVIDER_SITE_OTHER): Payer: BC Managed Care – PPO | Admitting: Internal Medicine

## 2012-12-13 VITALS — BP 144/90 | HR 88 | Ht 63.5 in | Wt 230.1 lb

## 2012-12-13 DIAGNOSIS — K625 Hemorrhage of anus and rectum: Secondary | ICD-10-CM

## 2012-12-13 DIAGNOSIS — K602 Anal fissure, unspecified: Secondary | ICD-10-CM

## 2012-12-13 MED ORDER — HYDROCORTISONE ACE-PRAMOXINE 2.5-1 % RE CREA: TOPICAL_CREAM | Freq: Three times a day (TID) | RECTAL | Status: DC

## 2012-12-13 NOTE — Patient Instructions (Addendum)
We have sent the following medications to your pharmacy for you to pick up at your convenience: Analpram  CC: Dr Neena Rhymes

## 2012-12-13 NOTE — Progress Notes (Signed)
Pamela Clarke Apr 23, 1976 MRN [id number]   History of Present Illness:  This is a 37 year old African American female with continued painless rectal bleeding. Blood is bright red and appears on toilet paper. Her last colonoscopy in November 2013 showed a hyperplastic polyp. She has been treated for hemorrhoids with Anusol-HC suppositories. She has anemia. Her last hemoglobin was 10.7. An upper abdominal ultrasound is negative. The bleeding has somewhat decreased after she used Anusol-HC suppositories but she still saw blood today. She denies constipation or straining.   Past Medical History  Diagnosis Date  . Arthritis   . Blood in stool   . Heart murmur   . Obesity   . Hypertension     history of this, no meds  . Fatty liver   . Hyperplastic colon polyp 10/22/12   Past Surgical History  Procedure Date  . Knee surgery 3 23 13     right  . Knee arthroscopy     left  . Breast reduction surgery 10/19/08    reports that she has never smoked. She has never used smokeless tobacco. She reports that she drinks alcohol. She reports that she does not use illicit drugs. family history includes Diabetes in her father; Heart disease in her maternal grandmother and paternal grandfather; Hypertension in her father and maternal grandmother; Ovarian cancer in her paternal grandmother; and Stroke in her father, maternal grandmother, and paternal grandfather.  There is no history of Colon cancer. No Known Allergies      Review of Systems: Negative for constipation straining abdominal pain  The remainder of the 10 point ROS is negative except as outlined in H&P   Physical Exam: General appearance  Well developed, in no distress. Eyes- non icteric. HEENT nontraumatic, normocephalic. Mouth no lesions, tongue papillated, no cheilosis. Neck supple without adenopathy, thyroid not enlarged, no carotid bruits, no JVD. Lungs Clear to auscultation bilaterally. Cor normal S1, normal S2, regular  rhythm, no murmur,  quiet precordium. Abdomen: Soft nontender. Rectal: Anal fissure 1 cm long at 7:00, not painful. It's clean-cut without scar tissue. Active bleeding observed during the anoscopic exam. Rectal ampulla is normal. Extremities no pedal edema. Skin no lesions. Neurological alert and oriented x 3. Psychological normal mood and affect.  Assessment and Plan:  Problem #1 Intermittent bleeding from an anal fissure which appears acute, no significant scar tissue present on todays exam. We will try topical steroids, Analpram cream 2.5% to use 3 times a day. She will discontinue Anusol-HC suppositories. If the fissure doesn't heal in the next 3 months, she will call us and we will refer her for surgical repair.continue high fiber diet, refrain from straining.   12/13/2012 Lina Sar

## 2013-04-11 ENCOUNTER — Other Ambulatory Visit: Payer: Self-pay | Admitting: *Deleted

## 2013-04-11 DIAGNOSIS — I10 Essential (primary) hypertension: Secondary | ICD-10-CM

## 2013-04-11 MED ORDER — HYDROCHLOROTHIAZIDE 12.5 MG PO TABS: 12.5000 mg | ORAL_TABLET | Freq: Every day | ORAL | Status: DC

## 2013-04-11 NOTE — Telephone Encounter (Signed)
Rx sent 

## 2013-05-07 ENCOUNTER — Encounter: Payer: Self-pay | Admitting: Family Medicine

## 2013-05-07 ENCOUNTER — Ambulatory Visit (INDEPENDENT_AMBULATORY_CARE_PROVIDER_SITE_OTHER): Payer: BC Managed Care – PPO | Admitting: Family Medicine

## 2013-05-07 VITALS — BP 138/90 | HR 86 | Temp 98.9°F | Ht 64.0 in | Wt 223.8 lb

## 2013-05-07 DIAGNOSIS — Z309 Encounter for contraceptive management, unspecified: Secondary | ICD-10-CM

## 2013-05-07 DIAGNOSIS — IMO0001 Reserved for inherently not codable concepts without codable children: Secondary | ICD-10-CM

## 2013-05-07 DIAGNOSIS — I10 Essential (primary) hypertension: Secondary | ICD-10-CM

## 2013-05-07 LAB — BASIC METABOLIC PANEL
BUN: 12 mg/dL (ref 6–23)
CO2: 28 mEq/L (ref 19–32)
Calcium: 8.9 mg/dL (ref 8.4–10.5)
GFR: 70.54 mL/min (ref >60.00)
Glucose, Bld: 88 mg/dL (ref 70–99)
Potassium: 3.9 mEq/L (ref 3.5–5.1)
Sodium: 142 mEq/L (ref 135–145)

## 2013-05-07 MED ORDER — NORGESTIMATE-ETH ESTRADIOL 0.25-35 MG-MCG PO TABS: 1.0000 | ORAL_TABLET | Freq: Every day | ORAL | Status: DC

## 2013-05-07 MED ORDER — LISINOPRIL-HYDROCHLOROTHIAZIDE 20-12.5 MG PO TABS: 1.0000 | ORAL_TABLET | Freq: Every day | ORAL | Status: DC

## 2013-05-07 NOTE — Progress Notes (Signed)
  Subjective:    Patient ID: Pamela Clarke, female    DOB: 16-Apr-1976, 37 y.o.   MRN: [id number]  HPI HTN- chronic problem, on Lisinopril 20 and HCTZ daily.  Exercising regularly.  No CP, SOB, HAs, visual changes, edema.  Birth control- pt not interested in having more than a few periods yearly.  Wants to start birth control.  Prefers pills to Depo.  LMP 6/1.  No chance of pregnancy- in same sex relationship.     Review of Systems For ROS see HPI     Objective:   Physical Exam  Vitals reviewed. Constitutional: She is oriented to person, place, and time. She appears well-developed and well-nourished. No distress.  HENT:  Head: Normocephalic and atraumatic.  Eyes: Conjunctivae and EOM are normal. Pupils are equal, round, and reactive to light.  Neck: Normal range of motion. Neck supple. No thyromegaly present.  Cardiovascular: Normal rate, regular rhythm, normal heart sounds and intact distal pulses.   No murmur heard. Pulmonary/Chest: Effort normal and breath sounds normal. No respiratory distress.  Abdominal: Soft. She exhibits no distension. There is no tenderness.  Musculoskeletal: She exhibits no edema.  Lymphadenopathy:    She has no cervical adenopathy.  Neurological: She is alert and oriented to person, place, and time.  Skin: Skin is warm and dry.  Psychiatric: She has a normal mood and affect. Her behavior is normal.          Assessment & Plan:

## 2013-05-07 NOTE — Assessment & Plan Note (Signed)
New.  Pt would prefer pills to Depo.  No chance of pregnancy- in same sex relationship and currently having period.  Start pills on Sunday.  Pt expressed understanding and is in agreement w/ plan.

## 2013-05-07 NOTE — Patient Instructions (Addendum)
Schedule your complete physical for September Start the Lisinopril HCTZ combo pill daily Start the birth control on Sunday We'll notify you of your lab results and make any changes if needed Have an amazing trip!!!

## 2013-05-07 NOTE — Assessment & Plan Note (Signed)
Chronic problem.  Adequate control today.  Will combine 2 meds into combo pill for pt convenience.  Asymptomatic.  Will continue to follow.

## 2013-05-08 ENCOUNTER — Encounter: Payer: Self-pay | Admitting: *Deleted

## 2013-06-20 ENCOUNTER — Other Ambulatory Visit: Payer: Self-pay | Admitting: *Deleted

## 2013-06-20 DIAGNOSIS — IMO0001 Reserved for inherently not codable concepts without codable children: Secondary | ICD-10-CM

## 2013-06-20 MED ORDER — NORGESTIMATE-ETH ESTRADIOL 0.25-35 MG-MCG PO TABS: 1.0000 | ORAL_TABLET | Freq: Every day | ORAL | Status: DC

## 2013-06-20 NOTE — Telephone Encounter (Signed)
Rx sent, Pt aware. 

## 2013-06-20 NOTE — Telephone Encounter (Signed)
Patient is calling to ask [place] to change her Sprintec Rx to a 90-day supply because of her new insurance.

## 2013-06-20 NOTE — Telephone Encounter (Signed)
Pharmacy requested that patient must have a refill for 90 days days do to insurance plan coverage.  Rx changed per Dr. Beverely Low.

## 2013-11-04 ENCOUNTER — Other Ambulatory Visit: Payer: Self-pay | Admitting: *Deleted

## 2013-11-04 ENCOUNTER — Encounter: Payer: Self-pay | Admitting: *Deleted

## 2013-11-04 DIAGNOSIS — K760 Fatty (change of) liver, not elsewhere classified: Secondary | ICD-10-CM

## 2013-11-20 ENCOUNTER — Telehealth: Payer: Self-pay | Admitting: *Deleted

## 2013-11-20 NOTE — Telephone Encounter (Signed)
Spoke with patient and she will come for labs. 

## 2013-11-20 NOTE — Telephone Encounter (Signed)
Message copied by Daphine Deutscher on Thu Nov 20, 2013  9:56 AM ------      Message from: Daphine Deutscher      Created: Tue Nov 04, 2013  1:39 PM       Call and remind patient due for LFT for DB prior to Jan OV ------

## 2013-12-04 HISTORY — PX: BREAST BIOPSY: SHX20

## 2013-12-07 ENCOUNTER — Encounter (HOSPITAL_BASED_OUTPATIENT_CLINIC_OR_DEPARTMENT_OTHER): Payer: Self-pay | Admitting: Emergency Medicine

## 2013-12-07 ENCOUNTER — Emergency Department (HOSPITAL_BASED_OUTPATIENT_CLINIC_OR_DEPARTMENT_OTHER)
Admission: EM | Admit: 2013-12-07 | Discharge: 2013-12-07 | Disposition: A | Discharge status: Home / Self Care | Payer: BC Managed Care – PPO | Attending: Emergency Medicine | Admitting: Emergency Medicine

## 2013-12-07 DIAGNOSIS — Z8719 Personal history of other diseases of the digestive system: Secondary | ICD-10-CM | POA: Insufficient documentation

## 2013-12-07 DIAGNOSIS — Z3202 Encounter for pregnancy test, result negative: Secondary | ICD-10-CM | POA: Insufficient documentation

## 2013-12-07 DIAGNOSIS — Z9114 Patient's other noncompliance with medication regimen: Secondary | ICD-10-CM

## 2013-12-07 DIAGNOSIS — M129 Arthropathy, unspecified: Secondary | ICD-10-CM | POA: Insufficient documentation

## 2013-12-07 DIAGNOSIS — Z79899 Other long term (current) drug therapy: Secondary | ICD-10-CM | POA: Insufficient documentation

## 2013-12-07 DIAGNOSIS — Z9119 Patient's noncompliance with other medical treatment and regimen: Secondary | ICD-10-CM | POA: Insufficient documentation

## 2013-12-07 DIAGNOSIS — R011 Cardiac murmur, unspecified: Secondary | ICD-10-CM | POA: Insufficient documentation

## 2013-12-07 DIAGNOSIS — I10 Essential (primary) hypertension: Secondary | ICD-10-CM

## 2013-12-07 DIAGNOSIS — E669 Obesity, unspecified: Secondary | ICD-10-CM | POA: Insufficient documentation

## 2013-12-07 DIAGNOSIS — Z860100 Personal history of colon polyps, unspecified: Secondary | ICD-10-CM | POA: Insufficient documentation

## 2013-12-07 DIAGNOSIS — Z91199 Patient's noncompliance with other medical treatment and regimen due to unspecified reason: Secondary | ICD-10-CM | POA: Insufficient documentation

## 2013-12-07 LAB — URINALYSIS, ROUTINE W REFLEX MICROSCOPIC
Bilirubin Urine: NEGATIVE
GLUCOSE, UA: NEGATIVE mg/dL
Hgb urine dipstick: NEGATIVE
Ketones, ur: NEGATIVE mg/dL
LEUKOCYTES UA: NEGATIVE
Nitrite: NEGATIVE
PROTEIN: NEGATIVE mg/dL
Specific Gravity, Urine: 1.022 (ref 1.005–1.030)
UROBILINOGEN UA: 4 mg/dL — AB (ref 0.0–1.0)
pH: 6.5 (ref 5.0–8.0)

## 2013-12-07 LAB — BASIC METABOLIC PANEL
BUN: 11 mg/dL (ref 6–23)
CALCIUM: 9.3 mg/dL (ref 8.4–10.5)
CHLORIDE: 101 meq/L (ref 96–112)
CO2: 25 mEq/L (ref 19–32)
CREATININE: 1 mg/dL (ref 0.50–1.10)
GFR calc non Af Amer: 71 mL/min — ABNORMAL LOW (ref >90)
GFR, EST AFRICAN AMERICAN: 82 mL/min — AB (ref >90)
Glucose, Bld: 106 mg/dL — ABNORMAL HIGH (ref 70–99)
Potassium: 3.8 mEq/L (ref 3.7–5.3)
Sodium: 138 mEq/L (ref 137–147)

## 2013-12-07 LAB — PREGNANCY, URINE: PREG TEST UR: NEGATIVE

## 2013-12-07 NOTE — ED Notes (Signed)
Pt states that she has not been taking her b/p meds for the past 4 months. States past several days she has been having frontal over left eye headaches. States she took her b/p at home today ant sbp 222/130 and pt took one of her b/p pills. Has been monitoring her b/p throughout the day which she said has decreased for a short time and states later today her bp was back up again in the 287O systolic with a headache. C/o some nausea. Denies vomiting denies fevers or any other symptoms. Denies any blurred vision or any weakness in her arms/legs. Pt alert and oriented times 3

## 2013-12-07 NOTE — ED Provider Notes (Signed)
CSN: [id number]     Arrival date & time 12/07/13  0121 History   First MD Initiated Contact with Patient 12/07/13 [phone]     Chief Complaint  Patient presents with  . Headache   (Consider location/radiation/quality/duration/timing/severity/associated sxs/prior Treatment) HPI 38 year old female with a history of hypertension and has been noncompliant with her antihypertensive for the past 4 months. She has had a headache since yesterday morning. The headache was located just above her left eye and was moderate in severity. The pain was eased when she pressed on the back of her neck. She took various over-the-counter analgesics without relief. She checked her blood pressure and found it to be 222/130 and took a hydrochlorothiazide tablet. She had subsequent relief of her pain and her blood pressure was significantly improved. The pain later returned and she rechecked her blood pressure and found it again to be elevated. She then took lisinopril again with improvement in her headache and her heart pressure. On arrival here her blood pressures noted to be 155/97. She denies any visual changes, focal neurologic deficits, chest pain or shortness of breath.  Past Medical History  Diagnosis Date  . Arthritis   . Blood in stool   . Heart murmur   . Obesity   . Hypertension     history of this, no meds  . Fatty liver   . Hyperplastic colon polyp 10/22/12   Past Surgical History  Procedure Laterality Date  . Knee surgery  3 23 13     right  . Knee arthroscopy      left  . Breast reduction surgery  10/19/08   Family History  Problem Relation Age of Onset  . Stroke Father   . Hypertension Father   . Diabetes Father   . Hypertension Maternal Grandmother   . Stroke Maternal Grandmother   . Ovarian cancer Paternal Grandmother   . Colon cancer Neg Hx   . Heart disease Paternal Grandfather   . Heart disease Maternal Grandmother   . Stroke Paternal Grandfather    History  Substance Use Topics   . Smoking status: Never Smoker   . Smokeless tobacco: Never Used  . Alcohol Use: Yes     Comment: occasionally wine 1-2 glasses q2 weeks   OB History   Grav Para Term Preterm Abortions TAB SAB Ect Mult Living                 Review of Systems  All other systems reviewed and are negative.    Allergies  Review of patient's allergies indicates no known allergies.  Home Medications   Current Outpatient Rx  Name  Route  Sig  Dispense  Refill  . calcium-vitamin D (OSCAL WITH D) 500-200 MG-UNIT per tablet   Oral   Take 2 tablets by mouth daily.         . fish oil-omega-3 fatty acids 1000 MG capsule   Oral   Take 2 g by mouth daily.         Marland Kitchen HYDROcodone-acetaminophen (NORCO/VICODIN) 5-325 MG per tablet      1-2 tablets every 4-6 hours as needed         . hydrocortisone-pramoxine (ANALPRAM-HC) 2.5-1 % rectal cream   Rectal   Place rectally 3 (three) times daily.   30 g   0   . Ibuprofen (ADVIL) 200 MG CAPS   Oral   Take by mouth as needed.         Marland Kitchen lisinopril-hydrochlorothiazide (ZESTORETIC) 20-12.5 MG per  tablet   Oral   Take 1 tablet by mouth daily.   90 tablet   3   . Multiple Vitamin (MULTIVITAMIN) tablet   Oral   Take 1 tablet by mouth daily.         . Multiple Vitamins-Minerals (MULTIVITAMIN PO)   Oral   Take 1 capsule by mouth daily.         . Naproxen Sodium (ALEVE) 220 MG CAPS   Oral   Take by mouth as needed.         . norgestimate-ethinyl estradiol (ORTHO-CYCLEN,SPRINTEC,PREVIFEM) 0.25-35 MG-MCG tablet   Oral   Take 1 tablet by mouth daily. Skip placebo pills and go right into next pack   3 Package   3    BP 155/97  Pulse 77  Temp(Src) 98.4 F (36.9 C) (Oral)  Resp 18  Ht 5\' 5"  (1.651 m)  Wt 220 lb (99.791 kg)  BMI 36.61 kg/m2  SpO2 100%  LMP 11/06/2013  Physical Exam General: Well-developed, well-nourished female in no acute distress; appearance consistent with age of record HENT: normocephalic; atraumatic Eyes:  pupils equal, round and reactive to light; extraocular muscles intact Neck: supple Heart: regular rate and rhythm; no murmurs, rubs or gallops Lungs: clear to auscultation bilaterally Abdomen: soft; nondistended; nontender; no masses or hepatosplenomegaly; bowel sounds present Extremities: No deformity; full range of motion; no edema Neurologic: Awake, alert and oriented; motor function intact in all extremities and symmetric; no facial droop Skin: Warm and dry Psychiatric: Normal mood and affect    ED Course  Procedures (including critical care time)    MDM   Nursing notes and vitals signs, including pulse oximetry, reviewed.  Summary of this visit's results, reviewed by myself:  Labs:  Results for orders placed during the hospital encounter of 12/07/13 (from the past 24 hour(s))  URINALYSIS, ROUTINE W REFLEX MICROSCOPIC     Status: Abnormal   Collection Time    12/07/13  1:45 AM      Result Value Range   Color, Urine AMBER (*) YELLOW   APPearance CLEAR  CLEAR   Specific Gravity, Urine 1.022  1.005 - 1.030   pH 6.5  5.0 - 8.0   Glucose, UA NEGATIVE  NEGATIVE mg/dL   Hgb urine dipstick NEGATIVE  NEGATIVE   Bilirubin Urine NEGATIVE  NEGATIVE   Ketones, ur NEGATIVE  NEGATIVE mg/dL   Protein, ur NEGATIVE  NEGATIVE mg/dL   Urobilinogen, UA 4.0 (*) 0.0 - 1.0 mg/dL   Nitrite NEGATIVE  NEGATIVE   Leukocytes, UA NEGATIVE  NEGATIVE  PREGNANCY, URINE     Status: None   Collection Time    12/07/13  1:45 AM      Result Value Range   Preg Test, Ur NEGATIVE  NEGATIVE  BASIC METABOLIC PANEL     Status: Abnormal   Collection Time    12/07/13  2:45 AM      Result Value Range   Sodium 138  137 - 147 mEq/L   Potassium 3.8  3.7 - 5.3 mEq/L   Chloride 101  96 - 112 mEq/L   CO2 25  19 - 32 mEq/L   Glucose, Bld 106 (*) 70 - 99 mg/dL   BUN 11  6 - 23 mg/dL   Creatinine, Ser 1.00  0.50 - 1.10 mg/dL   Calcium 9.3  8.4 - 10.5 mg/dL   GFR calc non Af Amer 71 (*) >90 mL/min   GFR calc  Af Amer 82 (*) >90 mL/min  The patient was advised on the dangers of uncontrolled hypertension in the long run and the need to be compliant with her antihypertensives.       Wynetta Fines, MD 12/07/13 [phone]

## 2013-12-10 ENCOUNTER — Ambulatory Visit: Payer: BC Managed Care – PPO | Admitting: Internal Medicine

## 2013-12-10 ENCOUNTER — Telehealth: Payer: Self-pay | Admitting: Internal Medicine

## 2013-12-10 NOTE — Telephone Encounter (Signed)
Charge, late cancellation.

## 2013-12-12 ENCOUNTER — Ambulatory Visit (INDEPENDENT_AMBULATORY_CARE_PROVIDER_SITE_OTHER): Payer: BC Managed Care – PPO | Admitting: Family Medicine

## 2013-12-12 ENCOUNTER — Encounter: Payer: Self-pay | Admitting: Family Medicine

## 2013-12-12 VITALS — BP 140/90 | HR 90 | Temp 98.5°F | Resp 16 | Wt 224.4 lb

## 2013-12-12 DIAGNOSIS — R112 Nausea with vomiting, unspecified: Secondary | ICD-10-CM

## 2013-12-12 DIAGNOSIS — K529 Noninfective gastroenteritis and colitis, unspecified: Secondary | ICD-10-CM

## 2013-12-12 DIAGNOSIS — K5289 Other specified noninfective gastroenteritis and colitis: Secondary | ICD-10-CM

## 2013-12-12 LAB — BASIC METABOLIC PANEL
BUN: 8 mg/dL (ref 6–23)
CO2: 26 meq/L (ref 19–32)
Calcium: 9.1 mg/dL (ref 8.4–10.5)
Chloride: 104 mEq/L (ref 96–112)
Creatinine, Ser: 1 mg/dL (ref 0.4–1.2)
GFR: 76.59 mL/min (ref >60.00)
GLUCOSE: 80 mg/dL (ref 70–99)
POTASSIUM: 3.8 meq/L (ref 3.5–5.1)
SODIUM: 137 meq/L (ref 135–145)

## 2013-12-12 LAB — CBC WITH DIFFERENTIAL/PLATELET
Basophils Absolute: 0.1 10*3/uL (ref 0.0–0.1)
Basophils Relative: 0.5 % (ref 0.0–3.0)
EOS PCT: 0.9 % (ref 0.0–5.0)
Eosinophils Absolute: 0.1 10*3/uL (ref 0.0–0.7)
HCT: 35.3 % — ABNORMAL LOW (ref 36.0–46.0)
Hemoglobin: 11.5 g/dL — ABNORMAL LOW (ref 12.0–15.0)
Lymphocytes Relative: 25.8 % (ref 12.0–46.0)
Lymphs Abs: 2.7 10*3/uL (ref 0.7–4.0)
MCHC: 32.6 g/dL (ref 30.0–36.0)
MCV: 86.9 fl (ref 78.0–100.0)
MONOS PCT: 6.9 % (ref 3.0–12.0)
Monocytes Absolute: 0.7 10*3/uL (ref 0.1–1.0)
NEUTROS PCT: 65.9 % (ref 43.0–77.0)
Neutro Abs: 6.9 10*3/uL (ref 1.4–7.7)
PLATELETS: 436 10*3/uL — AB (ref 150.0–400.0)
RBC: 4.06 Mil/uL (ref 3.87–5.11)
RDW: 12.4 % (ref 11.5–14.6)
WBC: 10.4 10*3/uL (ref 4.5–10.5)

## 2013-12-12 LAB — HEPATIC FUNCTION PANEL
ALBUMIN: 3.7 g/dL (ref 3.5–5.2)
ALK PHOS: 121 U/L — AB (ref 39–117)
ALT: 53 U/L — ABNORMAL HIGH (ref 0–35)
AST: 37 U/L (ref 0–37)
Bilirubin, Direct: 0.3 mg/dL (ref 0.0–0.3)
TOTAL PROTEIN: 7.6 g/dL (ref 6.0–8.3)
Total Bilirubin: 1.1 mg/dL (ref 0.3–1.2)

## 2013-12-12 MED ORDER — PROMETHAZINE HCL 25 MG/ML IJ SOLN
25.0000 mg | Freq: Once | INTRAMUSCULAR | Status: AC
  Administered 2013-12-12: 25 mg via INTRAMUSCULAR

## 2013-12-12 MED ORDER — ONDANSETRON 4 MG PO TBDP: 4.0000 mg | ORAL_TABLET | Freq: Three times a day (TID) | ORAL | Status: DC | PRN

## 2013-12-12 NOTE — Progress Notes (Signed)
   Subjective:    Patient ID: Pamela Clarke, female    DOB: 1976-11-21, 38 y.o.   MRN: [id number]  HPI BP was 220/130 and pt had a HA all day on Sunday 1/4.  Went to ER.  Admits noncompliance w/ BP meds.  Restarted after ER visit.  + nausea, vomiting, diarrhea.  Sweats, chills.  Attempting to work but has had to leave due to nausea, pre-syncope.  Low grade temp- 99.1 last night.  Not eating or drinking x1 week.  Denies cough, sinus pain, sore throat.  Has not vomited since Sunday but nausea remains.   Review of Systems For ROS see HPI     Objective:   Physical Exam  Vitals reviewed. Constitutional: She is oriented to person, place, and time. She appears well-developed and well-nourished. No distress.  HENT:  Head: Normocephalic and atraumatic.  MMM  Neck: Neck supple.  Cardiovascular: Normal rate, regular rhythm and intact distal pulses.   Pulmonary/Chest: Effort normal and breath sounds normal. No respiratory distress. She has no wheezes. She has no rales.  Abdominal: Soft. She exhibits no distension. There is no tenderness. There is no rebound.  Hyperactive BS  Lymphadenopathy:    She has no cervical adenopathy.  Neurological: She is alert and oriented to person, place, and time.  Skin: Skin is warm and dry.          Assessment & Plan:

## 2013-12-12 NOTE — Assessment & Plan Note (Signed)
Suspect viral.  Plan as above.

## 2013-12-12 NOTE — Patient Instructions (Signed)
Follow up as needed We'll notify you of your lab results and make any changes if needed Take the Zofran for the nausea Your job is to [employer]!  Small but frequent sips You will eat as you feel ready Call with any questions or concerns If symptoms worsen and you cannot/will not drink, you'll need to go to UC or ER for IV fluids Hang in there!

## 2013-12-12 NOTE — Progress Notes (Signed)
Pre visit review using our clinic review tool, if applicable. No additional management support is needed unless otherwise documented below in the visit note. 

## 2013-12-12 NOTE — Assessment & Plan Note (Signed)
New.  Suspect viral gastroenteritis.  No abd pain to suggest gallbladder or appendicitis.  Phenergan injxn given in office, script given.  Stressed the need for oral intake to avoid IV fluids.  Check labs to assess electrolytes and r/o bacterial infxn or biliary cause.  Reviewed supportive care and red flags that should prompt return.  Pt expressed understanding and is in agreement w/ plan.

## 2014-03-11 ENCOUNTER — Telehealth: Payer: Self-pay

## 2014-03-11 NOTE — Telephone Encounter (Signed)
Patient called to schedule an appt concerning chest pain. Patient states that she has been experiencing intermittent chest pain/tightness. Has been happening at work. BP has been within range at 136/85 today. Triaged per cardiac protocol. Patient was not experiencing any additional symptoms. Scheduled appt. Advised to go directly to an ED if symptoms worsen or new symptoms arise. Patient verbalizes understanding.

## 2014-03-13 ENCOUNTER — Ambulatory Visit (HOSPITAL_BASED_OUTPATIENT_CLINIC_OR_DEPARTMENT_OTHER)
Admission: RE | Admit: 2014-03-13 | Discharge: 2014-03-13 | Disposition: A | Discharge status: Home / Self Care | Payer: BC Managed Care – PPO | Source: Ambulatory Visit | Attending: Family Medicine | Admitting: Family Medicine

## 2014-03-13 ENCOUNTER — Ambulatory Visit (INDEPENDENT_AMBULATORY_CARE_PROVIDER_SITE_OTHER): Payer: BC Managed Care – PPO | Admitting: Family Medicine

## 2014-03-13 ENCOUNTER — Telehealth: Payer: Self-pay | Admitting: General Practice

## 2014-03-13 ENCOUNTER — Encounter (HOSPITAL_BASED_OUTPATIENT_CLINIC_OR_DEPARTMENT_OTHER): Payer: Self-pay

## 2014-03-13 ENCOUNTER — Encounter: Payer: Self-pay | Admitting: Family Medicine

## 2014-03-13 VITALS — BP 140/92 | HR 108 | Temp 99.1°F | Resp 16 | Wt 228.2 lb

## 2014-03-13 DIAGNOSIS — R5382 Chronic fatigue, unspecified: Secondary | ICD-10-CM | POA: Insufficient documentation

## 2014-03-13 DIAGNOSIS — R5381 Other malaise: Secondary | ICD-10-CM

## 2014-03-13 DIAGNOSIS — R5383 Other fatigue: Secondary | ICD-10-CM

## 2014-03-13 DIAGNOSIS — R0602 Shortness of breath: Secondary | ICD-10-CM

## 2014-03-13 DIAGNOSIS — R079 Chest pain, unspecified: Secondary | ICD-10-CM

## 2014-03-13 LAB — CBC WITH DIFFERENTIAL/PLATELET
BASOS ABS: 0 10*3/uL (ref 0.0–0.1)
BASOS PCT: 0.4 % (ref 0.0–3.0)
EOS ABS: 0.1 10*3/uL (ref 0.0–0.7)
Eosinophils Relative: 0.8 % (ref 0.0–5.0)
HCT: 32.8 % — ABNORMAL LOW (ref 36.0–46.0)
Hemoglobin: 10.6 g/dL — ABNORMAL LOW (ref 12.0–15.0)
LYMPHS PCT: 22.8 % (ref 12.0–46.0)
Lymphs Abs: 2.1 10*3/uL (ref 0.7–4.0)
MCHC: 32.3 g/dL (ref 30.0–36.0)
MCV: 88 fl (ref 78.0–100.0)
Monocytes Absolute: 0.6 10*3/uL (ref 0.1–1.0)
Monocytes Relative: 6.2 % (ref 3.0–12.0)
NEUTROS PCT: 69.8 % (ref 43.0–77.0)
Neutro Abs: 6.3 10*3/uL (ref 1.4–7.7)
PLATELETS: 448 10*3/uL — AB (ref 150.0–400.0)
RBC: 3.73 Mil/uL — AB (ref 3.87–5.11)
RDW: 12.2 % (ref 11.5–14.6)
WBC: 9 10*3/uL (ref 4.5–10.5)

## 2014-03-13 LAB — BASIC METABOLIC PANEL
BUN: 14 mg/dL (ref 6–23)
CALCIUM: 9 mg/dL (ref 8.4–10.5)
CO2: 26 meq/L (ref 19–32)
CREATININE: 1 mg/dL (ref 0.4–1.2)
Chloride: 103 mEq/L (ref 96–112)
GFR: 84.91 mL/min (ref >60.00)
Glucose, Bld: 87 mg/dL (ref 70–99)
Potassium: 3.8 mEq/L (ref 3.5–5.1)
Sodium: 135 mEq/L (ref 135–145)

## 2014-03-13 LAB — TSH: TSH: 1.65 u[IU]/mL (ref 0.35–5.50)

## 2014-03-13 LAB — D-DIMER, QUANTITATIVE (NOT AT ARMC): D DIMER QUANT: 1.44 ug{FEU}/mL — AB (ref 0.00–0.48)

## 2014-03-13 MED ORDER — OMEPRAZOLE 20 MG PO CPDR: 20.0000 mg | DELAYED_RELEASE_CAPSULE | Freq: Every day | ORAL | Status: DC

## 2014-03-13 MED ORDER — IOHEXOL 350 MG/ML SOLN
100.0000 mL | Freq: Once | INTRAVENOUS | Status: AC | PRN
  Administered 2014-03-13: 100 mL via INTRAVENOUS

## 2014-03-13 MED ORDER — GI COCKTAIL ~~LOC~~
30.0000 mL | Freq: Once | ORAL | Status: AC
  Administered 2014-03-13: 30 mL via ORAL

## 2014-03-13 NOTE — Telephone Encounter (Signed)
Commented on pt's lab results

## 2014-03-13 NOTE — Telephone Encounter (Signed)
Pamela Clarke from [facility] called and said that pt's D-Dimer was 1.44.

## 2014-03-13 NOTE — Assessment & Plan Note (Signed)
New.  Check labs to r/o anemia, electrolyte abnormality or hypothyroid.  May be due to underlying depression.  If labs normal, may need to start SSRI.  Will follow.

## 2014-03-13 NOTE — Addendum Note (Signed)
Addended by: [name] on: 03/13/2014 04:00 PM   Modules accepted: Orders

## 2014-03-13 NOTE — Assessment & Plan Note (Signed)
New.  EKG WNL.  Suspect this is combination of stress and reflux but must r/o PE w/ D Dimer due to pt's use of OCPs.  Start PPI.  Little to no improvement w/ GI cocktail.  Check labs.  Will continue to follow- may need SSRI if things don't improve.

## 2014-03-13 NOTE — Telephone Encounter (Signed)
Pt.notified

## 2014-03-13 NOTE — Progress Notes (Signed)
   Subjective:    Patient ID: Pamela Clarke, female    DOB: January 09, 1976, 38 y.o.   MRN: [id number]  HPI Chest pain- sxs started 3 days ago.  1st occurred at work.  Pain was substernal and shooting 'straight through to back between shoulder blades'.  Had sensation of 'just needing to take a deep breath'.  Increased fatigue for 2-3 weeks which is very abnormal for pt.  Pt was having difficulty at gym b/c machine was limiting her due to elevated HR.  Denies GERD.  Taking BP meds daily.  Increased stress both at work and home.  Is on OCPs.     Review of Systems For ROS see HPI     Objective:   Physical Exam  Vitals reviewed. Constitutional: She is oriented to person, place, and time. She appears well-developed and well-nourished. No distress.  HENT:  Head: Normocephalic and atraumatic.  Eyes: Conjunctivae and EOM are normal. Pupils are equal, round, and reactive to light.  Neck: Normal range of motion. Neck supple. No thyromegaly present.  Cardiovascular: Normal rate, regular rhythm, normal heart sounds and intact distal pulses.   No murmur heard. Pulmonary/Chest: Effort normal and breath sounds normal. No respiratory distress.  Abdominal: Soft. She exhibits no distension. There is no tenderness.  Musculoskeletal: She exhibits no edema.  Lymphadenopathy:    She has no cervical adenopathy.  Neurological: She is alert and oriented to person, place, and time.  Skin: Skin is warm and dry.  Psychiatric: Her behavior is normal.  Flat affect          Assessment & Plan:

## 2014-03-13 NOTE — Patient Instructions (Signed)
Follow up in 2-3 weeks to recheck symptoms We'll notify you of your lab results and make any changes if needed Start the Omeprazole daily to decrease possible reflux Try and find a stress outlet- this will help the mood! Consider starting meds to help w/ anxiety/depression If symptoms change or worsen, please call! Hang in there!!

## 2014-03-13 NOTE — Progress Notes (Signed)
Pre visit review using our clinic review tool, if applicable. No additional management support is needed unless otherwise documented below in the visit note. 

## 2014-03-13 NOTE — Assessment & Plan Note (Signed)
New.  May be anxiety but need to r/o PE due to OCP use.  Will get Ddimer and determine next steps.  Reviewed supportive care and red flags that should prompt return.  Pt expressed understanding and is in agreement w/ plan.

## 2014-03-24 ENCOUNTER — Encounter: Payer: Self-pay | Admitting: Family Medicine

## 2014-03-24 ENCOUNTER — Emergency Department (HOSPITAL_COMMUNITY): Payer: BC Managed Care – PPO

## 2014-03-24 ENCOUNTER — Encounter: Payer: Self-pay | Admitting: Internal Medicine

## 2014-03-24 ENCOUNTER — Ambulatory Visit (INDEPENDENT_AMBULATORY_CARE_PROVIDER_SITE_OTHER): Payer: BC Managed Care – PPO | Admitting: Internal Medicine

## 2014-03-24 ENCOUNTER — Emergency Department (HOSPITAL_COMMUNITY)
Admission: EM | Admit: 2014-03-24 | Discharge: 2014-03-25 | Disposition: A | Discharge status: Home / Self Care | Payer: BC Managed Care – PPO | Attending: Emergency Medicine | Admitting: Emergency Medicine

## 2014-03-24 ENCOUNTER — Encounter (HOSPITAL_COMMUNITY): Payer: Self-pay | Admitting: Emergency Medicine

## 2014-03-24 VITALS — BP 131/82 | HR 93 | Temp 98.0°F | Wt 231.0 lb

## 2014-03-24 DIAGNOSIS — R079 Chest pain, unspecified: Secondary | ICD-10-CM

## 2014-03-24 DIAGNOSIS — Z860100 Personal history of colon polyps, unspecified: Secondary | ICD-10-CM | POA: Insufficient documentation

## 2014-03-24 DIAGNOSIS — R5381 Other malaise: Secondary | ICD-10-CM | POA: Insufficient documentation

## 2014-03-24 DIAGNOSIS — I10 Essential (primary) hypertension: Secondary | ICD-10-CM | POA: Insufficient documentation

## 2014-03-24 DIAGNOSIS — R011 Cardiac murmur, unspecified: Secondary | ICD-10-CM | POA: Insufficient documentation

## 2014-03-24 DIAGNOSIS — Z79899 Other long term (current) drug therapy: Secondary | ICD-10-CM | POA: Insufficient documentation

## 2014-03-24 DIAGNOSIS — R0609 Other forms of dyspnea: Secondary | ICD-10-CM

## 2014-03-24 DIAGNOSIS — D649 Anemia, unspecified: Secondary | ICD-10-CM

## 2014-03-24 DIAGNOSIS — M129 Arthropathy, unspecified: Secondary | ICD-10-CM | POA: Insufficient documentation

## 2014-03-24 DIAGNOSIS — R0600 Dyspnea, unspecified: Secondary | ICD-10-CM

## 2014-03-24 DIAGNOSIS — R5383 Other fatigue: Secondary | ICD-10-CM

## 2014-03-24 DIAGNOSIS — R0602 Shortness of breath: Secondary | ICD-10-CM | POA: Insufficient documentation

## 2014-03-24 DIAGNOSIS — Z791 Long term (current) use of non-steroidal anti-inflammatories (NSAID): Secondary | ICD-10-CM | POA: Insufficient documentation

## 2014-03-24 DIAGNOSIS — Z8719 Personal history of other diseases of the digestive system: Secondary | ICD-10-CM | POA: Insufficient documentation

## 2014-03-24 DIAGNOSIS — E669 Obesity, unspecified: Secondary | ICD-10-CM | POA: Insufficient documentation

## 2014-03-24 DIAGNOSIS — R0989 Other specified symptoms and signs involving the circulatory and respiratory systems: Secondary | ICD-10-CM

## 2014-03-24 LAB — BASIC METABOLIC PANEL
BUN: 11 mg/dL (ref 6–23)
CHLORIDE: 103 meq/L (ref 96–112)
CO2: 23 mEq/L (ref 19–32)
Calcium: 9.3 mg/dL (ref 8.4–10.5)
Creatinine, Ser: 0.96 mg/dL (ref 0.50–1.10)
GFR calc non Af Amer: 75 mL/min — ABNORMAL LOW (ref >90)
GFR, EST AFRICAN AMERICAN: 87 mL/min — AB (ref >90)
Glucose, Bld: 87 mg/dL (ref 70–99)
POTASSIUM: 4 meq/L (ref 3.7–5.3)
Sodium: 139 mEq/L (ref 137–147)

## 2014-03-24 LAB — CBC WITH DIFFERENTIAL/PLATELET
BASOS ABS: 0 10*3/uL (ref 0.0–0.1)
BASOS PCT: 0 % (ref 0–1)
Eosinophils Absolute: 0.1 10*3/uL (ref 0.0–0.7)
Eosinophils Relative: 1 % (ref 0–5)
HCT: 32.9 % — ABNORMAL LOW (ref 36.0–46.0)
Hemoglobin: 10.5 g/dL — ABNORMAL LOW (ref 12.0–15.0)
Lymphocytes Relative: 32 % (ref 12–46)
Lymphs Abs: 4.2 10*3/uL — ABNORMAL HIGH (ref 0.7–4.0)
MCH: 27.9 pg (ref 26.0–34.0)
MCHC: 31.9 g/dL (ref 30.0–36.0)
MCV: 87.3 fL (ref 78.0–100.0)
Monocytes Absolute: 0.7 10*3/uL (ref 0.1–1.0)
Monocytes Relative: 5 % (ref 3–12)
NEUTROS ABS: 8 10*3/uL — AB (ref 1.7–7.7)
Neutrophils Relative %: 62 % (ref 43–77)
PLATELETS: 430 10*3/uL — AB (ref 150–400)
RBC: 3.77 MIL/uL — ABNORMAL LOW (ref 3.87–5.11)
RDW: 12 % (ref 11.5–15.5)
WBC: 13 10*3/uL — ABNORMAL HIGH (ref 4.0–10.5)

## 2014-03-24 LAB — I-STAT TROPONIN, ED: Troponin i, poc: 0 ng/mL (ref 0.00–0.08)

## 2014-03-24 LAB — PRO B NATRIURETIC PEPTIDE: PRO B NATRI PEPTIDE: 110.4 pg/mL (ref 0–125)

## 2014-03-24 NOTE — ED Provider Notes (Signed)
CSN: [id number]     Arrival date & time 03/24/14  2101 History   First MD Initiated Contact with Patient 03/24/14 2201     Chief Complaint  Patient presents with  . Chest Pain  . Shortness of Breath     (Consider location/radiation/quality/duration/timing/severity/associated sxs/prior Treatment) Patient is a 38 y.o. female presenting with chest pain and shortness of breath. The history is provided by the patient and medical records.  Chest Pain Associated symptoms: fatigue and shortness of breath   Shortness of Breath Associated symptoms: chest pain    This is a 38 year old female with past medical history significant for hypertension not currently on meds, obesity, heart murmur, presenting to the ED for chest pain, shortness of breath, and generalized fatigue for the past several weeks. She was seen by her primary care physician, Dr. Birdie Riddle, with labs performed revealing an elevated d-dimer. CT angio chest was ordered which was negative for PE but did find a small pulmonary nodule, recommended follow-up in 3-6 months.  Patient states she has continued having pain which seems to move around her chest, and changes in character from aching to sharp, stabbing pains. States she is short of breath with exertional activities such as walking upstairs. Patient was again seen by her primary care physician earlier today, it seemed to bleed that her symptoms were largely due to anxiety. Patient states she does not feel anxious or overly stressed. She does admit that she is concerned and is somewhat afraid now that she knows she has a pulmonary nodule but states "these symptoms were present before i even knew about the nodule, i just feel like something is wrong."  No personal hx of CAD.  + family hx, father and grandmother.  Pt has never been a smoker. Patient denies any recent cough, congestion, sore throat, fever, or chills.  Pt was referred to cardiology and pulmonology for further work-up.  VS stable on  arrival.  Past Medical History  Diagnosis Date  . Arthritis   . Blood in stool   . Heart murmur   . Obesity   . Hypertension     history of this, no meds  . Fatty liver   . Hyperplastic colon polyp 10/22/12   Past Surgical History  Procedure Laterality Date  . Knee surgery  3 23 13     right  . Knee arthroscopy      left  . Breast reduction surgery  10/19/08   Family History  Problem Relation Age of Onset  . Stroke Father   . Hypertension Father   . Diabetes Father   . Hypertension Maternal Grandmother   . Stroke Maternal Grandmother   . Ovarian cancer Paternal Grandmother   . Colon cancer Neg Hx   . Heart disease Paternal Grandfather   . Heart disease Maternal Grandmother   . Stroke Paternal Grandfather    History  Substance Use Topics  . Smoking status: Never Smoker   . Smokeless tobacco: Never Used  . Alcohol Use: Yes     Comment: occasionally wine 1-2 glasses q2 weeks   OB History   Grav Para Term Preterm Abortions TAB SAB Ect Mult Living                 Review of Systems  Constitutional: Positive for fatigue.  Respiratory: Positive for shortness of breath.   Cardiovascular: Positive for chest pain.  All other systems reviewed and are negative.     Allergies  Review of patient's allergies indicates no  known allergies.  Home Medications   Prior to Admission medications   Medication Sig Start Date End Date Taking? Authorizing Provider  b complex vitamins tablet Take 1 tablet by mouth daily.   Yes Historical Provider, MD  calcium-vitamin D (OSCAL WITH D) 500-200 MG-UNIT per tablet Take 3 tablets by mouth daily.    Yes Historical Provider, MD  ferrous sulfate 325 (65 FE) MG EC tablet Take 325 mg by mouth daily.   Yes Historical Provider, MD  fish oil-omega-3 fatty acids 1000 MG capsule Take 2 g by mouth daily.   Yes Historical Provider, MD  ibuprofen (ADVIL,MOTRIN) 200 MG tablet Take 800 mg by mouth every 6 (six) hours as needed for moderate pain.    Yes Historical Provider, MD  lisinopril-hydrochlorothiazide (ZESTORETIC) 20-12.5 MG per tablet Take 1 tablet by mouth daily. 05/07/13  Yes [name], MD  Multiple Vitamin (MULTIVITAMIN) tablet Take 1 tablet by mouth daily.   Yes Historical Provider, MD  norgestimate-ethinyl estradiol (ORTHO-CYCLEN,SPRINTEC,PREVIFEM) 0.25-35 MG-MCG tablet Take 1 tablet by mouth daily. Skip placebo pills and go right into next pack 06/20/13  Yes [name], MD   BP 132/83  Pulse 87  Temp(Src) 98.6 F (37 C) (Oral)  Resp 18  Ht 5\' 5"  (1.651 m)  Wt 228 lb (103.42 kg)  BMI 37.94 kg/m2  SpO2 100%  LMP 12/09/2013  Physical Exam  Nursing note and vitals reviewed. Constitutional: She is oriented to person, place, and time. She appears well-developed and well-nourished.  HENT:  Head: Normocephalic and atraumatic.  Mouth/Throat: Oropharynx is clear and moist.  Eyes: Conjunctivae and EOM are normal. Pupils are equal, round, and reactive to light.  Neck: Normal range of motion.  Cardiovascular: Normal rate, regular rhythm and normal heart sounds.   Pulmonary/Chest: Effort normal and breath sounds normal. No respiratory distress. She has no wheezes.  Chest wall nontender, no deformities  Abdominal: Soft. Bowel sounds are normal. There is no tenderness. There is no guarding.  Musculoskeletal: Normal range of motion. She exhibits no edema.  No calf asymmetry, erythema, warmth to touch, or palpable cords  Neurological: She is alert and oriented to person, place, and time.  Skin: Skin is warm and dry.  Psychiatric: She has a normal mood and affect.    ED Course  Procedures (including critical care time) Labs Review Labs Reviewed  CBC WITH DIFFERENTIAL - Abnormal; Notable for the following:    WBC 13.0 (*)    RBC 3.77 (*)    Hemoglobin 10.5 (*)    HCT 32.9 (*)    Platelets 430 (*)    Neutro Abs 8.0 (*)    Lymphs Abs 4.2 (*)    All other components within normal limits  BASIC METABOLIC PANEL  - Abnormal; Notable for the following:    GFR calc non Af Amer 75 (*)    GFR calc Af Amer 87 (*)    All other components within normal limits  PRO B NATRIURETIC PEPTIDE  URINALYSIS, ROUTINE W REFLEX MICROSCOPIC  I-STAT TROPOININ, ED  POC URINE PREG, ED    Imaging Review Dg Chest 2 View  03/24/2014   CLINICAL DATA:  Chest pain, shortness of breath.  EXAM: CHEST  2 VIEW  COMPARISON:  CT ANGIO CHEST W/CM &/OR WO/CM dated 03/13/2014  FINDINGS: Cardiomediastinal silhouette is unremarkable. The lungs are clear without pleural effusions or focal consolidations. Trachea projects midline and there is no pneumothorax. Soft tissue planes and included osseous structures are non-suspicious.  IMPRESSION: No  active cardiopulmonary disease.   Electronically Signed   By: Elon Alas   On: 03/24/2014 23:01     EKG Interpretation   Date/Time:  Tuesday March 24 2014 21:35:38 EDT Ventricular Rate:  79 PR Interval:  171 QRS Duration: 67 QT Interval:  365 QTC Calculation: 418 R Axis:   42 Text Interpretation:  Sinus rhythm Confirmed by HORTON  MD, COURTNEY  ([id number]) on 03/24/2014 11:29:03 PM      MDM   Final diagnoses:  Chest pain   EKG normal sinus without ischemic change. Labs obtained which are reassuring. Chest x-ray is clear. Troponin is negative after several weeks of ongoing chest pain.  On exam, pt does appear somewhat anxious and became tearful when discussing her current situation.  Given negative work-up today and negative CT angio <2 weeks ago, i have low suspicion for ACS, PE, dissection, or other acute cardiac event.  Pts VS have remained stable without signs of respiratory distress.  Pt was given a dose of ativan, no change in symptoms.  Pt has already been scheduled to FU with cardiology and pulmonology-- will call tomorrow to schedule appts.  Discussed plan with pt, she acknowledged understanding and agreed with plan of care.  Return precautions given for new or worsening  symptoms.  Larene Pickett, PA-C 03/25/14 [phone]

## 2014-03-24 NOTE — ED Notes (Signed)
Pt states that she has had chest pain. Shortness of breath, fatigue and welling to lower extremities x several weeks; pt states that she saw her PCP and had a CT scan negative for blood clot but had a pulmonary nodule; pt states that she did not tell her MD about the leg swelling until today; pt states that she is having chest pain radiating through to her back that has been off and on for several weeks; pt states that she gets short of breath with normal activities including talking on the phone; pt is concerned over the symptoms she continues to have.

## 2014-03-24 NOTE — Patient Instructions (Signed)
Will schedule additional tests, please come back and see her primary doctor in 4 weeks

## 2014-03-24 NOTE — Telephone Encounter (Signed)
Spoke with the pt and informed her that I spoke with Dr. Birdie Riddle and she stated that the pt does need to be seen before (03-31-14),b/c of the new sxs.  Pt understood and agreed.  Pt was scheduled to be seen today by Dr. Rolla Flatten

## 2014-03-24 NOTE — Progress Notes (Signed)
Pre visit review using our clinic review tool, if applicable. No additional management support is needed unless otherwise documented below in the visit note. 

## 2014-03-24 NOTE — ED Notes (Signed)
Pt sts she is currently unable to urinate. RN notified

## 2014-03-24 NOTE — Progress Notes (Signed)
   Subjective:    Patient ID: Pamela Clarke, female    DOB: 06-13-1976, 38 y.o.   MRN: [id number]  DOS:  03/24/2014 Type of  visit: followup from previous visit 2 months history of chest pain, dyspnea on exertion which is quite unusual for her. Also feeling fatigued. + peri-ankle edema but no calf swelling or pain. Was seen by PCP for 03-13-14---> EKG normal, hemoglobin 10.6, BMP okay, d-dimer +, CT chest no pulmonary emboli. She is here because she continue with the same symptoms.  ROS Denies palpitation Occasional cough with yellow sputum, no hemoptysis, no wheezing. She was tearful today, reports that she does not feel anxious, depressed, no suicidal ideas. Is tearful simply because she does not feel well. No GERD symptoms.   Past Medical History  Diagnosis Date  . Arthritis   . Blood in stool   . Heart murmur   . Obesity   . Hypertension     history of this, no meds  . Fatty liver   . Hyperplastic colon polyp 10/22/12    Past Surgical History  Procedure Laterality Date  . Knee surgery  3 23 13     right  . Knee arthroscopy      left  . Breast reduction surgery  10/19/08    History   Social History  . Marital Status: Single    Spouse Name: N/A    Number of Children: 0  . Years of Education: N/A   Occupational History  . pharmacy tech    Social History Main Topics  . Smoking status: Never Smoker   . Smokeless tobacco: Never Used  . Alcohol Use: Yes     Comment: occasionally wine 1-2 glasses q2 weeks  . Drug Use: No  . Sexual Activity: Yes    Birth Control/ Protection: None   Other Topics Concern  . Not on file   Social History Narrative   Lives w/ female partner        Medication List    Notice   Cannot display patient medications because the patient has not yet arrived.         Objective:   Physical Exam BP 131/82  Pulse 93  Temp(Src) 98 F (36.7 C)  Wt 231 lb (104.781 kg)  SpO2 100%  LMP 12/09/2013  General -- alert,  well-developed, NAD.   Lungs -- normal respiratory effort, no intercostal retractions, no accessory muscle use, and normal breath sounds.  Heart-- normal rate, regular rhythm, no murmur.  Extremities-- no pretibial edema bilaterally; calves symmetric  Neurologic--  alert & oriented X3. Speech normal, gait normal, strength normal in all extremities.  Psych-- Cognition and judgment appear intact. Cooperative with normal attention span and concentration. Tearful, seems stressed       Assessment & Plan:   Chest pain, dyspnea on exertion, 38 year old lady with above symptoms  Recent CT showed no PE. No symptoms consistent with asthma. She does have mild anemia and labs will be rechecked today. I believe part of her problem is severe anxiety, she's quite adamant that is not an issue. Plan: PFTs, cards referral (ECHO, stress test?), return to the office 4  Weeks w/ PCP.

## 2014-03-25 LAB — URINALYSIS, ROUTINE W REFLEX MICROSCOPIC
BILIRUBIN URINE: NEGATIVE
Glucose, UA: NEGATIVE mg/dL
Hgb urine dipstick: NEGATIVE
KETONES UR: NEGATIVE mg/dL
Leukocytes, UA: NEGATIVE
Nitrite: NEGATIVE
PH: 5.5 (ref 5.0–8.0)
Protein, ur: NEGATIVE mg/dL
Specific Gravity, Urine: 1.031 — ABNORMAL HIGH (ref 1.005–1.030)
Urobilinogen, UA: 1 mg/dL (ref 0.0–1.0)

## 2014-03-25 LAB — IRON: IRON: 73 ug/dL (ref 42–145)

## 2014-03-25 LAB — FOLATE: Folate: >24.8 ng/mL (ref >5.9)

## 2014-03-25 LAB — VITAMIN B12: VITAMIN B 12: 293 pg/mL (ref 211–911)

## 2014-03-25 LAB — FERRITIN: FERRITIN: 94 ng/mL (ref 10.0–291.0)

## 2014-03-25 LAB — HEMOGLOBIN: Hemoglobin: 11 g/dL — ABNORMAL LOW (ref 12.0–15.0)

## 2014-03-25 LAB — POC URINE PREG, ED: Preg Test, Ur: NEGATIVE

## 2014-03-25 MED ORDER — LORAZEPAM 1 MG PO TABS
1.0000 mg | ORAL_TABLET | Freq: Once | ORAL | Status: AC
  Administered 2014-03-25: 1 mg via ORAL
  Filled 2014-03-25: qty 1

## 2014-03-25 NOTE — ED Provider Notes (Signed)
Medical screening examination/treatment/procedure(s) were performed by non-physician practitioner and as supervising physician I was immediately available for consultation/collaboration.   EKG Interpretation   Date/Time:  Tuesday March 24 2014 21:35:38 EDT Ventricular Rate:  79 PR Interval:  171 QRS Duration: 67 QT Interval:  365 QTC Calculation: 418 R Axis:   42 Text Interpretation:  Sinus rhythm Confirmed by Dina Rich  MD, Riot Waterworth  [id number]) on 03/24/2014 11:29:03 PM       Merryl Hacker, MD 03/25/14 1900

## 2014-03-25 NOTE — Discharge Instructions (Signed)
Follow-up with cardiology and pulmonology as previously recommended. Return to the ED for new concerns.

## 2014-03-30 ENCOUNTER — Encounter: Payer: Self-pay | Admitting: Cardiology

## 2014-03-30 ENCOUNTER — Ambulatory Visit (INDEPENDENT_AMBULATORY_CARE_PROVIDER_SITE_OTHER): Payer: BC Managed Care – PPO | Admitting: Cardiology

## 2014-03-30 VITALS — BP 136/92 | HR 74 | Ht 65.0 in | Wt 229.0 lb

## 2014-03-30 DIAGNOSIS — I10 Essential (primary) hypertension: Secondary | ICD-10-CM

## 2014-03-30 DIAGNOSIS — R0602 Shortness of breath: Secondary | ICD-10-CM

## 2014-03-30 DIAGNOSIS — R079 Chest pain, unspecified: Secondary | ICD-10-CM

## 2014-03-30 MED ORDER — HYDROCHLOROTHIAZIDE 25 MG PO TABS: 25.0000 mg | ORAL_TABLET | Freq: Every day | ORAL | Status: DC

## 2014-03-30 MED ORDER — AMLODIPINE BESYLATE 5 MG PO TABS: 5.0000 mg | ORAL_TABLET | Freq: Every day | ORAL | Status: DC

## 2014-03-30 NOTE — Patient Instructions (Signed)
STOP TAKING ZESTORETIC   START TAKING AMLODIPINE 5 MG DAILY   START TAKING HYDROCHLOROTHIAZIDE 25 MG DAILY  Your physician has requested that you have an echocardiogram. Echocardiography is a painless test that uses sound waves to create images of your heart. It provides your doctor with information about the size and shape of your heart and how well your heart's chambers and valves are working. This procedure takes approximately one hour. There are no restrictions for this procedure.  Your physician has requested that you have an exercise tolerance test. For further information please visit [email or web address]. Please also follow instruction sheet, as given.  Your physician recommends that you follow-up PENDING TESTS RESULTS

## 2014-03-30 NOTE — Progress Notes (Signed)
Patient ID: Pamela Clarke, female   DOB: Apr 17, 1976, 38 y.o.   MRN: [id number]    Patient Name: Pamela Clarke Date of Encounter: 03/30/2014  Primary Care Provider:  [name], MD Primary Cardiologist:  Dorothy Spark  Problem List   Past Medical History  Diagnosis Date  . Arthritis   . Blood in stool   . Heart murmur   . Obesity   . Hypertension     history of this, no meds  . Fatty liver   . Hyperplastic colon polyp 10/22/12   Past Surgical History  Procedure Laterality Date  . Knee surgery  3 23 13     right  . Knee arthroscopy      left  . Breast reduction surgery  10/19/08   Allergies  No Known Allergies  HPI  A very pleasant 38 year old female with h/o hypertension who is coming complaining of SOB for the last 2 months. 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 Now she has episodes of SOB associated with chest pressure radiating to her back that are non-exertional. She feels overal tired. Small activities would make her SOB. On one occassion she went to the ER where CTA was performed for PE that was ruled out but showed 2 lung nodules 3 mm and 5 mm. She is exposed to second hand smoking. She has never smoked herself. She has significant FH of CAD, father had MI at age of 20, aunt died of dilated heart, GF of CVA. She denies palpitations, syncope, orthopnea, but has mild LE edema. She was started on Lisinopril earlier this year and has had dry cough since then.  Home Medications  Prior to Admission medications   Medication Sig Start Date End Date Taking? Authorizing Provider  b complex vitamins tablet Take 1 tablet by mouth daily.   Yes Historical Provider, MD  calcium-vitamin D (OSCAL WITH D) 500-200 MG-UNIT per tablet Take 3 tablets by mouth daily.    Yes Historical Provider, MD  ferrous sulfate 325 (65 FE) MG EC tablet Take 325 mg by mouth daily.   Yes Historical Provider, MD  fish oil-omega-3 fatty acids 1000 MG capsule Take 2 g  by mouth daily.   Yes Historical Provider, MD  ibuprofen (ADVIL,MOTRIN) 200 MG tablet Take 800 mg by mouth every 6 (six) hours as needed for moderate pain.   Yes Historical Provider, MD  lisinopril-hydrochlorothiazide (ZESTORETIC) 20-12.5 MG per tablet Take 1 tablet by mouth daily. 05/07/13  Yes [name], MD  Multiple Vitamin (MULTIVITAMIN) tablet Take 1 tablet by mouth daily.   Yes Historical Provider, MD  norgestimate-ethinyl estradiol (ORTHO-CYCLEN,SPRINTEC,PREVIFEM) 0.25-35 MG-MCG tablet Take 1 tablet by mouth daily. Skip placebo pills and go right into next pack 06/20/13  Yes [name], MD    Family History  Family History  Problem Relation Age of Onset  . Stroke Father   . Hypertension Father   . Diabetes Father   . Hypertension Maternal Grandmother   . Stroke Maternal Grandmother   . Ovarian cancer Paternal Grandmother   . Colon cancer Neg Hx   . Heart disease Paternal Grandfather   . Heart disease Maternal Grandmother   . Stroke Paternal Grandfather     Social History  History   Social History  . Marital Status: Single    Spouse Name: N/A    Number of Children: 0  . Years of Education: N/A   Occupational History  . pharmacy tech  Social History Main Topics  . Smoking status: Never Smoker   . Smokeless tobacco: Never Used  . Alcohol Use: Yes     Comment: occasionally wine 1-2 glasses q2 weeks  . Drug Use: No  . Sexual Activity: Yes    Birth Control/ Protection: None   Other Topics Concern  . Not on file   Social History Narrative   Lives w/ female partner     Review of Systems, as per HPI, otherwise negative General:  No chills, fever, night sweats or weight changes.  Cardiovascular:  No chest pain, dyspnea on exertion, edema, orthopnea, palpitations, paroxysmal nocturnal dyspnea. Dermatological: No rash, lesions/masses Respiratory: No cough, dyspnea Urologic: No hematuria, dysuria Abdominal:   No nausea, vomiting, diarrhea, bright  red blood per rectum, melena, or hematemesis Neurologic:  No visual changes, wkns, changes in mental status. All other systems reviewed and are otherwise negative except as noted above.  Physical Exam  Blood pressure 136/92, pulse 74, height 5\' 5"  (1.651 m), weight 229 lb (103.874 kg), last menstrual period 12/09/2013, SpO2 99.00%.  General: Pleasant, NAD Psych: Normal affect. Neuro: Alert and oriented X 3. Moves all extremities spontaneously. HEENT: Normal  Neck: Supple without bruits or JVD. Lungs:  Resp regular and unlabored, CTA. Heart: RRR no s3, s4, or murmurs. Abdomen: Soft, non-tender, non-distended, BS + x 4.  Extremities: No clubbing, cyanosis, 1+ LE edema. DP/PT/Radials 2+ and equal bilaterally.  Labs:  No results found for this basename: CKTOTAL, CKMB, TROPONINI,  in the last 72 hours Lab Results  Component Value Date   WBC 13.0* 03/24/2014   HGB 10.5* 03/24/2014   HCT 32.9* 03/24/2014   MCV 87.3 03/24/2014   PLT 430* 03/24/2014    Lab Results  Component Value Date   DDIMER 1.44* 03/13/2014   No components found with this basename: POCBNP,     Component Value Date/Time   NA 139 03/24/2014 2227   K 4.0 03/24/2014 2227   CL 103 03/24/2014 2227   CO2 23 03/24/2014 2227   GLUCOSE 87 03/24/2014 2227   BUN 11 03/24/2014 2227   CREATININE 0.96 03/24/2014 2227   CALCIUM 9.3 03/24/2014 2227   PROT 7.6 12/12/2013 1431   ALBUMIN 3.7 12/12/2013 1431   AST 37 12/12/2013 1431   ALT 53* 12/12/2013 1431   ALKPHOS 121* 12/12/2013 1431   BILITOT 1.1 12/12/2013 1431   GFRNONAA 75* 03/24/2014 2227   GFRAA 87* 03/24/2014 2227   Lab Results  Component Value Date   CHOL 94 08/22/2012   HDL 41.80 08/22/2012   LDLCALC 31 08/22/2012   TRIG 105.0 08/22/2012    Accessory Clinical Findings  echocardiogram  ECG - SR, normal ECG   Assessment & Plan  A pleasant 38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 Non-exertional SOB associated with chest pain We will order an  exercise treadmill stress test to assess for ischemia and BP response. Also oredr echocardiogram to assess systolic and diastlolic function and intracardiac pressure  2. HTN - uncontrolled - D/L lisinopril as she is coughing, start amlodipine 5 mg po daily and HCTZ 25 mg po daily  3. Lipid - at goal  All labs reviewed and normal including TSH, mild anemia wouldn't cause such significant symptoms  Follow up after tests.   Dorothy Spark, MD, [facility] 03/30/2014, 1:51 PM

## 2014-03-31 ENCOUNTER — Ambulatory Visit (INDEPENDENT_AMBULATORY_CARE_PROVIDER_SITE_OTHER): Payer: BC Managed Care – PPO | Admitting: Family Medicine

## 2014-03-31 ENCOUNTER — Encounter: Payer: Self-pay | Admitting: Family Medicine

## 2014-03-31 ENCOUNTER — Ambulatory Visit (HOSPITAL_COMMUNITY)
Admission: RE | Admit: 2014-03-31 | Discharge: 2014-03-31 | Disposition: A | Discharge status: Home / Self Care | Payer: BC Managed Care – PPO | Source: Ambulatory Visit | Attending: Cardiology | Admitting: Cardiology

## 2014-03-31 VITALS — BP 132/80 | HR 82 | Temp 98.2°F | Wt 228.0 lb

## 2014-03-31 DIAGNOSIS — I10 Essential (primary) hypertension: Secondary | ICD-10-CM | POA: Insufficient documentation

## 2014-03-31 DIAGNOSIS — R079 Chest pain, unspecified: Secondary | ICD-10-CM

## 2014-03-31 DIAGNOSIS — F419 Anxiety disorder, unspecified: Secondary | ICD-10-CM

## 2014-03-31 DIAGNOSIS — R0602 Shortness of breath: Secondary | ICD-10-CM

## 2014-03-31 DIAGNOSIS — F32A Depression, unspecified: Secondary | ICD-10-CM

## 2014-03-31 DIAGNOSIS — F341 Dysthymic disorder: Secondary | ICD-10-CM

## 2014-03-31 DIAGNOSIS — F418 Other specified anxiety disorders: Secondary | ICD-10-CM | POA: Insufficient documentation

## 2014-03-31 DIAGNOSIS — F329 Major depressive disorder, single episode, unspecified: Secondary | ICD-10-CM | POA: Insufficient documentation

## 2014-03-31 MED ORDER — FLUOXETINE HCL 20 MG PO TABS: 20.0000 mg | ORAL_TABLET | Freq: Every day | ORAL | Status: DC

## 2014-03-31 NOTE — Assessment & Plan Note (Signed)
New.  Pt now agreeable to start medication.  Suspect this is playing a role in both chest pain and SOB.  Will start low dose Prozac and monitor for improvement.  Pt expressed understanding and is in agreement w/ plan.

## 2014-03-31 NOTE — Assessment & Plan Note (Signed)
Ongoing.  Pt has ruled out for PE.  Has stress test later today.  Will continue to follow.

## 2014-03-31 NOTE — Progress Notes (Signed)
   Subjective:    Patient ID: Pamela Clarke, female    DOB: [birth year], 38 y.o.   MRN: [id number]  HPI CP- chest pains are persisting.  Energy has improved since starting Super B complex.  Saw Cards yesterday and has stress test today.  Pt reports she has had persistent swelling of R leg (had surgery in 2013).  Saw Dr Larose Kells last week, wasn't happy- 'he just kinda threw his hands up at me'.  Pt does admit to previous smoking but not regularly and heavy exposure to 2nd hand smoke.  Will need chest CT in 6 months rather than 12.  Mild anemia- just started iron.   Review of Systems For ROS see HPI     Objective:   Physical Exam  Vitals reviewed. Constitutional: She is oriented to person, place, and time. She appears well-developed and well-nourished. No distress.  HENT:  Head: Normocephalic and atraumatic.  Eyes: Conjunctivae and EOM are normal. Pupils are equal, round, and reactive to light.  Neck: Normal range of motion. Neck supple. No thyromegaly present.  Cardiovascular: Normal rate, regular rhythm, normal heart sounds and intact distal pulses.   No murmur heard. Pulmonary/Chest: Effort normal and breath sounds normal. No respiratory distress.  Abdominal: Soft. She exhibits no distension. There is no tenderness.  Musculoskeletal: She exhibits no edema.  Lymphadenopathy:    She has no cervical adenopathy.  Neurological: She is alert and oriented to person, place, and time.  Skin: Skin is warm and dry.  Psychiatric: Her behavior is normal.  Tearful, anxious          Assessment & Plan:

## 2014-03-31 NOTE — Progress Notes (Signed)
Pre visit review using our clinic review tool, if applicable. No additional management support is needed unless otherwise documented below in the visit note. 

## 2014-03-31 NOTE — Patient Instructions (Signed)
Follow up in 1 month to recheck mood Start the Prozac daily for mood and anxiety We'll be following along w/ what cardiology and pulmonary say Continue the iron (this can be constipating so add a stool softener if needed) Call with any questions or concerns Hang in there!!!

## 2014-03-31 NOTE — Assessment & Plan Note (Signed)
Ongoing.  Pt has appt scheduled for pulmonary testing 5/21.  Will continue to monitor.

## 2014-04-01 ENCOUNTER — Ambulatory Visit: Payer: BC Managed Care – PPO | Admitting: Cardiology

## 2014-04-01 ENCOUNTER — Telehealth: Payer: Self-pay | Admitting: Family Medicine

## 2014-04-01 NOTE — Telephone Encounter (Signed)
Relevant patient education assigned to patient using Emmi. ° °

## 2014-04-02 ENCOUNTER — Other Ambulatory Visit (HOSPITAL_COMMUNITY): Payer: Self-pay | Admitting: *Deleted

## 2014-04-02 ENCOUNTER — Ambulatory Visit (HOSPITAL_COMMUNITY): Payer: BC Managed Care – PPO | Attending: Cardiovascular Disease | Admitting: *Deleted

## 2014-04-02 DIAGNOSIS — R0602 Shortness of breath: Secondary | ICD-10-CM

## 2014-04-02 DIAGNOSIS — R0600 Dyspnea, unspecified: Secondary | ICD-10-CM

## 2014-04-02 DIAGNOSIS — R0989 Other specified symptoms and signs involving the circulatory and respiratory systems: Secondary | ICD-10-CM | POA: Insufficient documentation

## 2014-04-02 DIAGNOSIS — R0609 Other forms of dyspnea: Secondary | ICD-10-CM | POA: Insufficient documentation

## 2014-04-02 DIAGNOSIS — R079 Chest pain, unspecified: Secondary | ICD-10-CM

## 2014-04-02 NOTE — Progress Notes (Signed)
Echo complete

## 2014-04-04 ENCOUNTER — Encounter: Payer: Self-pay | Admitting: Cardiology

## 2014-04-06 ENCOUNTER — Telehealth: Payer: Self-pay | Admitting: *Deleted

## 2014-04-06 DIAGNOSIS — R0602 Shortness of breath: Secondary | ICD-10-CM

## 2014-04-06 DIAGNOSIS — R079 Chest pain, unspecified: Secondary | ICD-10-CM

## 2014-04-06 DIAGNOSIS — I10 Essential (primary) hypertension: Secondary | ICD-10-CM

## 2014-04-06 MED ORDER — AMLODIPINE BESYLATE 10 MG PO TABS: 10.0000 mg | ORAL_TABLET | Freq: Every day | ORAL | Status: DC

## 2014-04-06 NOTE — Telephone Encounter (Signed)
Advised patient of results and medication change, verbalized understanding

## 2014-04-06 NOTE — Telephone Encounter (Signed)
Message copied by Earvin Hansen on Mon Apr 06, 2014  6:43 PM ------      Message from: Dorothy Spark      Created: Mon Apr 06, 2014  4:08 PM       Would Ut noted his echocardiogram was normal. He's stress test was negative for ischemia however hypertensive response to stress. Therefore we will increase amlodipine to 10 mg daily. Could you please prescribe it for him and call him.      Thank you      Ena Dawley       ------

## 2014-04-22 ENCOUNTER — Encounter: Payer: Self-pay | Admitting: *Deleted

## 2014-04-23 ENCOUNTER — Ambulatory Visit (INDEPENDENT_AMBULATORY_CARE_PROVIDER_SITE_OTHER): Payer: BC Managed Care – PPO | Admitting: Internal Medicine

## 2014-04-23 DIAGNOSIS — R0989 Other specified symptoms and signs involving the circulatory and respiratory systems: Secondary | ICD-10-CM

## 2014-04-23 DIAGNOSIS — R0600 Dyspnea, unspecified: Secondary | ICD-10-CM

## 2014-04-23 DIAGNOSIS — R0609 Other forms of dyspnea: Secondary | ICD-10-CM

## 2014-04-23 NOTE — Progress Notes (Signed)
PFT done today. 

## 2014-04-29 ENCOUNTER — Encounter: Payer: Self-pay | Admitting: Cardiology

## 2014-04-30 ENCOUNTER — Ambulatory Visit (INDEPENDENT_AMBULATORY_CARE_PROVIDER_SITE_OTHER): Payer: BC Managed Care – PPO | Admitting: Family Medicine

## 2014-04-30 ENCOUNTER — Encounter: Payer: Self-pay | Admitting: Family Medicine

## 2014-04-30 VITALS — BP 130/88 | HR 95 | Temp 98.2°F | Resp 16 | Wt 222.4 lb

## 2014-04-30 DIAGNOSIS — F329 Major depressive disorder, single episode, unspecified: Secondary | ICD-10-CM

## 2014-04-30 DIAGNOSIS — F32A Depression, unspecified: Secondary | ICD-10-CM

## 2014-04-30 DIAGNOSIS — F341 Dysthymic disorder: Secondary | ICD-10-CM

## 2014-04-30 DIAGNOSIS — R0602 Shortness of breath: Secondary | ICD-10-CM

## 2014-04-30 DIAGNOSIS — R079 Chest pain, unspecified: Secondary | ICD-10-CM

## 2014-04-30 DIAGNOSIS — F419 Anxiety disorder, unspecified: Principal | ICD-10-CM

## 2014-04-30 MED ORDER — LOSARTAN POTASSIUM 25 MG PO TABS: 25.0000 mg | ORAL_TABLET | Freq: Every day | ORAL | Status: DC

## 2014-04-30 NOTE — Progress Notes (Signed)
Pre visit review using our clinic review tool, if applicable. No additional management support is needed unless otherwise documented below in the visit note. 

## 2014-04-30 NOTE — Progress Notes (Signed)
   Subjective:    Patient ID: Pamela Clarke, female    DOB: [birth year], 38 y.o.   MRN: [id number]  HPI Anxiety/depression- remains fatigued.  Pt feels that Prozac has improved the mood.  Less anxiety.  Sleeping well but reports getting very hot at night.  Pt has lost 6 lbs in a month- walking regularly w/ dog.  HTN- chronic problem, Losartan was added today per Cardiology (in addition to the Amlodipine and HCTZ).  Still having CP, SOB- but 'not as bad'.  Had normal stress test, ECHO.  Had PFTs w/ pulmonary last week- no results available yet.  Had normal CXR, CT ang w/ 2 small pulmonary nodules but otherwise normal.   Review of Systems For ROS see HPI     Objective:   Physical Exam  Vitals reviewed. Constitutional: She is oriented to person, place, and time. She appears well-developed and well-nourished.  HENT:  Head: Normocephalic and atraumatic.  Cardiovascular: Normal rate, regular rhythm, normal heart sounds and intact distal pulses.   Pulmonary/Chest: Effort normal and breath sounds normal. No respiratory distress. She has no wheezes. She has no rales. She exhibits no tenderness.  Neurological: She is alert and oriented to person, place, and time.  Skin: Skin is warm and dry.  Psychiatric:  Tearful, obviously upset and scared b/c she doesn't understand her symptoms          Assessment & Plan:

## 2014-04-30 NOTE — Telephone Encounter (Signed)
Per Dr Meda Coffee this pt should start taking Losartan 25 mg po daily due to recent elevated BP readings pt sent to [place] via mychart.  Pt notified of new med order and confirmed pharmacy of choice with pt.  Pt pleased with the follow-up and agrees with this treatment plan.

## 2014-04-30 NOTE — Assessment & Plan Note (Signed)
Pt feels mood has improved but she remains tearful in office.  Pt to continue prozac- no dose adjustment at this time.

## 2014-04-30 NOTE — Assessment & Plan Note (Signed)
Ongoing problem.  Pt has seen pulmonary and had recent PFTs w/o official result (i attempted to review and things appear to be normal but will wait for official results).  Work up has been normal so far- will get labs to r/o rheumatologic process.  Will continue to follow.

## 2014-04-30 NOTE — Patient Instructions (Signed)
Follow up as needed We'll notify you of your lab results and make any changes if needed We'll figure this out! HANG IN THERE!!

## 2014-04-30 NOTE — Assessment & Plan Note (Signed)
Pt has had normal cardiac w/u thus far but continues to have sxs.  CXR, CT, labs have all been unremarkable thus far.  Pt has not had rheumatologic w/u to r/o lupus or sarcoid.  Will get ANA and ESR to assess.  Will continue to follow.

## 2014-05-01 ENCOUNTER — Other Ambulatory Visit: Payer: Self-pay | Admitting: Family Medicine

## 2014-05-01 DIAGNOSIS — R700 Elevated erythrocyte sedimentation rate: Secondary | ICD-10-CM

## 2014-05-01 LAB — ANA: Anti Nuclear Antibody(ANA): NEGATIVE

## 2014-05-01 LAB — SEDIMENTATION RATE: SED RATE: 32 mm/h — AB (ref 0–22)

## 2014-05-03 LAB — PULMONARY FUNCTION TEST
DL/VA % PRED: 108 %
DL/VA: 5.14 ml/min/mmHg/L
DLCO UNC % PRED: 88 %
DLCO unc: 20.83 ml/min/mmHg
FEF 25-75 POST: 3.79 L/s
FEF 25-75 PRE: 2.79 L/s
FEF2575-%CHANGE-POST: 35 %
FEF2575-%PRED-POST: 131 %
FEF2575-%PRED-PRE: 96 %
FEV1-%CHANGE-POST: 5 %
FEV1-%PRED-PRE: 98 %
FEV1-%Pred-Post: 103 %
FEV1-PRE: 2.52 L
FEV1-Post: 2.65 L
FEV1FVC-%Change-Post: 3 %
FEV1FVC-%PRED-PRE: 100 %
FEV6-%CHANGE-POST: 1 %
FEV6-%PRED-POST: 99 %
FEV6-%Pred-Pre: 98 %
FEV6-Post: 3.01 L
FEV6-Pre: 2.96 L
FEV6FVC-%CHANGE-POST: 0 %
FEV6FVC-%PRED-PRE: 101 %
FEV6FVC-%Pred-Post: 101 %
FVC-%CHANGE-POST: 1 %
FVC-%Pred-Post: 98 %
FVC-%Pred-Pre: 97 %
FVC-POST: 3.02 L
FVC-Pre: 2.97 L
POST FEV1/FVC RATIO: 88 %
PRE FEV1/FVC RATIO: 85 %
Post FEV6/FVC ratio: 100 %
Pre FEV6/FVC Ratio: 99 %
RV % pred: 116 %
RV: 1.76 L
TLC % pred: 97 %
TLC: 4.84 L

## 2014-05-12 ENCOUNTER — Emergency Department (HOSPITAL_COMMUNITY)
Admission: EM | Admit: 2014-05-12 | Discharge: 2014-05-12 | Disposition: A | Discharge status: Home / Self Care | Payer: BC Managed Care – PPO | Attending: Emergency Medicine | Admitting: Emergency Medicine

## 2014-05-12 ENCOUNTER — Emergency Department (HOSPITAL_COMMUNITY): Payer: BC Managed Care – PPO

## 2014-05-12 ENCOUNTER — Encounter (HOSPITAL_COMMUNITY): Payer: Self-pay | Admitting: Emergency Medicine

## 2014-05-12 ENCOUNTER — Telehealth: Payer: Self-pay

## 2014-05-12 DIAGNOSIS — R011 Cardiac murmur, unspecified: Secondary | ICD-10-CM | POA: Insufficient documentation

## 2014-05-12 DIAGNOSIS — Z860100 Personal history of colon polyps, unspecified: Secondary | ICD-10-CM | POA: Insufficient documentation

## 2014-05-12 DIAGNOSIS — R0789 Other chest pain: Secondary | ICD-10-CM

## 2014-05-12 DIAGNOSIS — Z8739 Personal history of other diseases of the musculoskeletal system and connective tissue: Secondary | ICD-10-CM | POA: Insufficient documentation

## 2014-05-12 DIAGNOSIS — Z8719 Personal history of other diseases of the digestive system: Secondary | ICD-10-CM | POA: Insufficient documentation

## 2014-05-12 DIAGNOSIS — Z79899 Other long term (current) drug therapy: Secondary | ICD-10-CM | POA: Insufficient documentation

## 2014-05-12 DIAGNOSIS — E669 Obesity, unspecified: Secondary | ICD-10-CM | POA: Insufficient documentation

## 2014-05-12 DIAGNOSIS — I10 Essential (primary) hypertension: Secondary | ICD-10-CM | POA: Insufficient documentation

## 2014-05-12 LAB — BASIC METABOLIC PANEL
BUN: 13 mg/dL (ref 6–23)
CHLORIDE: 96 meq/L (ref 96–112)
CO2: 27 mEq/L (ref 19–32)
Calcium: 9.5 mg/dL (ref 8.4–10.5)
Creatinine, Ser: 0.9 mg/dL (ref 0.50–1.10)
GFR calc Af Amer: >90 mL/min (ref >90)
GFR calc non Af Amer: 81 mL/min — ABNORMAL LOW (ref >90)
Glucose, Bld: 106 mg/dL — ABNORMAL HIGH (ref 70–99)
Potassium: 3.4 mEq/L — ABNORMAL LOW (ref 3.7–5.3)
Sodium: 138 mEq/L (ref 137–147)

## 2014-05-12 LAB — CBC
HCT: 32.2 % — ABNORMAL LOW (ref 36.0–46.0)
Hemoglobin: 10.4 g/dL — ABNORMAL LOW (ref 12.0–15.0)
MCH: 27.4 pg (ref 26.0–34.0)
MCHC: 32.3 g/dL (ref 30.0–36.0)
MCV: 85 fL (ref 78.0–100.0)
Platelets: 513 10*3/uL — ABNORMAL HIGH (ref 150–400)
RBC: 3.79 MIL/uL — ABNORMAL LOW (ref 3.87–5.11)
RDW: 11.8 % (ref 11.5–15.5)
WBC: 16.3 10*3/uL — ABNORMAL HIGH (ref 4.0–10.5)

## 2014-05-12 LAB — I-STAT TROPONIN, ED
TROPONIN I, POC: 0 ng/mL (ref 0.00–0.08)
Troponin i, poc: 0 ng/mL (ref 0.00–0.08)

## 2014-05-12 LAB — D-DIMER, QUANTITATIVE (NOT AT ARMC): D-Dimer, Quant: <0.27 ug/mL-FEU (ref 0.00–0.48)

## 2014-05-12 MED ORDER — FENTANYL CITRATE 0.05 MG/ML IJ SOLN
50.0000 ug | Freq: Once | INTRAMUSCULAR | Status: AC
  Administered 2014-05-12: 50 ug via INTRAVENOUS
  Filled 2014-05-12: qty 2

## 2014-05-12 MED ORDER — NITROGLYCERIN 0.4 MG SL SUBL
0.4000 mg | SUBLINGUAL_TABLET | SUBLINGUAL | Status: DC | PRN
  Administered 2014-05-12 (×2): 0.4 mg via SUBLINGUAL

## 2014-05-12 NOTE — ED Notes (Signed)
Pt states she had noticed some swelling in her ankles over the past couple days while on vacation.  Pt states while on the 2 hr plane ride home from [place] she got a warm flushed feeling all over and then started having cp since with intermittent SOB

## 2014-05-12 NOTE — ED Notes (Signed)
Patient reports she has been seeing doctors for workup on her lungs for "possible nodules".  Complaints of shortness of breath, history of hypertension.

## 2014-05-12 NOTE — ED Provider Notes (Signed)
CSN: [id number]     Arrival date & time 05/12/14  0011 History   First MD Initiated Contact with Patient 05/12/14 0410     Chief Complaint  Patient presents with  . Chest Pain  . Leg Swelling     (Consider location/radiation/quality/duration/timing/severity/associated sxs/prior Treatment) HPI Complains of anterior chest pain onset 5 PM yesterday, pleuritic, radiating to back. Accompanied by bilateral leg edema. The leg swelling has resolved since in the emergency department. She was treated with sublingual nitroglycerin and her venous fentanyl with relief. Pain is worse with deep inspiration. No other associated symptoms. Past Medical History  Diagnosis Date  . Arthritis   . Blood in stool   . Heart murmur   . Obesity   . Hypertension     history of this, no meds  . Fatty liver   . Hyperplastic colon polyp 10/22/12   she reports had negative stress test and outpatient cardiac workup April 2015. Also had negative CT angiogram of chest April 2015. Past Surgical History  Procedure Laterality Date  . Knee surgery  3 23 13     right  . Knee arthroscopy      left  . Breast reduction surgery  10/19/08   Family History  Problem Relation Age of Onset  . Stroke Father   . Hypertension Father   . Diabetes Father   . Hypertension Maternal Grandmother   . Stroke Maternal Grandmother   . Ovarian cancer Paternal Grandmother   . Colon cancer Neg Hx   . Heart disease Paternal Grandfather   . Heart disease Maternal Grandmother   . Stroke Paternal Grandfather    History  Substance Use Topics  . Smoking status: Never Smoker   . Smokeless tobacco: Never Used  . Alcohol Use: Yes     Comment: occasionally wine 1-2 glasses q2 weeks   family history father had MI at 61 OB History   Grav Para Term Preterm Abortions TAB SAB Ect Mult Living                 Review of Systems  Respiratory: Positive for shortness of breath.   Cardiovascular: Positive for chest pain and leg swelling.  All  other systems reviewed and are negative.     Allergies  Review of patient's allergies indicates no known allergies.  Home Medications   Prior to Admission medications   Medication Sig Start Date End Date Taking? Authorizing Provider  amLODipine (NORVASC) 10 MG tablet Take 1 tablet (10 mg total) by mouth daily. 04/06/14  Yes Dorothy Spark, MD  FLUoxetine (PROZAC) 20 MG tablet Take 1 tablet (20 mg total) by mouth daily. 03/31/14  Yes [name], MD  hydrochlorothiazide (HYDRODIURIL) 25 MG tablet Take 1 tablet (25 mg total) by mouth daily. 03/30/14  Yes Dorothy Spark, MD  losartan (COZAAR) 25 MG tablet Take 1 tablet (25 mg total) by mouth daily. 04/30/14  Yes Dorothy Spark, MD  Multiple Vitamin (MULTIVITAMIN) tablet Take 1 tablet by mouth daily.   Yes Historical Provider, MD  norgestimate-ethinyl estradiol (ORTHO-CYCLEN,SPRINTEC,PREVIFEM) 0.25-35 MG-MCG tablet Take 1 tablet by mouth daily. Skip placebo pills and go right into next pack 06/20/13  Yes [name], MD  omeprazole (PRILOSEC) 20 MG capsule Take 20 mg by mouth daily.  04/22/14  Yes Historical Provider, MD   BP 120/74  Pulse 77  Temp(Src) 98.2 F (36.8 C) (Oral)  Resp 14  Ht 5\' 3"  (1.6 m)  Wt 220 lb (99.791 kg)  BMI 38.98 kg/m2  SpO2 100% Physical Exam  Nursing note and vitals reviewed. Constitutional: She appears well-developed and well-nourished.  HENT:  Head: Normocephalic and atraumatic.  Eyes: Conjunctivae are normal. Pupils are equal, round, and reactive to light.  Neck: Neck supple. No tracheal deviation present. No thyromegaly present.  Cardiovascular: Normal rate and regular rhythm.   No murmur heard. Pulmonary/Chest: Effort normal and breath sounds normal.  Abdominal: Soft. Bowel sounds are normal. She exhibits no distension. There is no tenderness.  Musculoskeletal: Normal range of motion. She exhibits no edema and no tenderness.  Neurological: She is alert. Coordination normal.  Skin:  Skin is warm and dry. No rash noted.  Psychiatric: She has a normal mood and affect.    ED Course  Procedures (including critical care time) Labs Review Labs Reviewed  CBC - Abnormal; Notable for the following:    WBC 16.3 (*)    RBC 3.79 (*)    Hemoglobin 10.4 (*)    HCT 32.2 (*)    Platelets 513 (*)    All other components within normal limits  BASIC METABOLIC PANEL - Abnormal; Notable for the following:    Potassium 3.4 (*)    Glucose, Bld 106 (*)    GFR calc non Af Amer 81 (*)    All other components within normal limits  D-DIMER, QUANTITATIVE  I-STAT TROPOININ, ED  Randolm Idol, ED    Imaging Review Dg Chest Port 1 View  05/12/2014   CLINICAL DATA:  Chest pain and shortness of breath. Swelling in the ankles.  EXAM: PORTABLE CHEST - 1 VIEW  COMPARISON:  03/24/2014  FINDINGS: The heart size and mediastinal contours are within normal limits. Both lungs are clear. The visualized skeletal structures are unremarkable.  IMPRESSION: No active disease.   Electronically Signed   By: Lucienne Capers M.D.   On: 05/12/2014 02:36     EKG Interpretation None      Date: 05/12/2014  Rate: 105  Rhythm: sinus tachycardia  QRS Axis: normal  Intervals: normal  ST/T Wave abnormalities: nonspecific T wave changes  Conduction Disutrbances:none  Narrative Interpretation:   Old EKG Reviewed: Rate slightly increased over 03/24/2014 otherwise no significant change interpreted by me  Chest x-ray viewed by me.  Results for orders placed during the hospital encounter of 05/12/14  CBC      Result Value Ref Range   WBC 16.3 (*) 4.0 - 10.5 K/uL   RBC 3.79 (*) 3.87 - 5.11 MIL/uL   Hemoglobin 10.4 (*) 12.0 - 15.0 g/dL   HCT 32.2 (*) 36.0 - 46.0 %   MCV 85.0  78.0 - 100.0 fL   MCH 27.4  26.0 - 34.0 pg   MCHC 32.3  30.0 - 36.0 g/dL   RDW 11.8  11.5 - 15.5 %   Platelets 513 (*) 150 - 400 K/uL  BASIC METABOLIC PANEL      Result Value Ref Range   Sodium 138  137 - 147 mEq/L   Potassium  3.4 (*) 3.7 - 5.3 mEq/L   Chloride 96  96 - 112 mEq/L   CO2 27  19 - 32 mEq/L   Glucose, Bld 106 (*) 70 - 99 mg/dL   BUN 13  6 - 23 mg/dL   Creatinine, Ser 0.90  0.50 - 1.10 mg/dL   Calcium 9.5  8.4 - 10.5 mg/dL   GFR calc non Af Amer 81 (*) >90 mL/min   GFR calc Af Amer >90  >90 mL/min  D-DIMER,  QUANTITATIVE      Result Value Ref Range   D-Dimer, Quant <0.27  0.00 - 0.48 ug/mL-FEU  I-STAT TROPOININ, ED      Result Value Ref Range   Troponin i, poc 0.00  0.00 - 0.08 ng/mL   Comment 3           I-STAT TROPOININ, ED      Result Value Ref Range   Troponin i, poc 0.00  0.00 - 0.08 ng/mL   Comment 3            Dg Chest Port 1 View  05/12/2014   CLINICAL DATA:  Chest pain and shortness of breath. Swelling in the ankles.  EXAM: PORTABLE CHEST - 1 VIEW  COMPARISON:  03/24/2014  FINDINGS: The heart size and mediastinal contours are within normal limits. Both lungs are clear. The visualized skeletal structures are unremarkable.  IMPRESSION: No active disease.   Electronically Signed   By: Lucienne Capers M.D.   On: 05/12/2014 02:36    6 AM patient resting comfortably. States discomfort is minimal.  MDM  Wells score for pe is 1.5,low risk heart score is1, low risk Final diagnoses:  None   plan followup with Dr.Tabori as outpatient Patient exhibiting no leg edema on my exam Chest pain is highly atypical for cardiac. Low pretest clinical probability for pulmonary embolism, negative d-dimer.anemia is chronic Diagnosis#1 atypical chest pain #2 anemia      Orlie Dakin, MD 05/12/14 [phone]

## 2014-05-12 NOTE — Discharge Instructions (Signed)
Chest Pain (Nonspecific). Take Tylenol as instructed for pain. Call Dr.Tabori today to schedule an office visit. Tell office staff that you were seen here Chest pain has many causes. Your pain could be caused by something serious, such as a heart attack or a blood clot in the lungs. It could also be caused by something less serious, such as a chest bruise or a virus. Follow up with your doctor. More lab tests or other studies may be needed to find the cause of your pain. Most of the time, nonspecific chest pain will improve within 2 to 3 days of rest and mild pain medicine. HOME CARE  For chest bruises, you may put ice on the sore area for 15-20 minutes, 03-04 times a day. Do this only if it makes you feel better.  Put ice in a plastic bag.  Place a towel between the skin and the bag.  Rest for the next 2 to 3 days.  Go back to work if the pain improves.  See your doctor if the pain lasts longer than 1 to 2 weeks.  Only take medicine as told by your doctor.  Quit smoking if you smoke. GET HELP RIGHT AWAY IF:   There is more pain or pain that spreads to the arm, neck, jaw, back, or belly (abdomen).  You have shortness of breath.  You cough more than usual or cough up blood.  You have very bad back or belly pain, feel sick to your stomach (nauseous), or throw up (vomit).  You have very bad weakness.  You pass out (faint).  You have a fever. Any of these problems may be serious and may be an emergency. Do not wait to see if the problems will go away. Get medical help right away. Call your local emergency services 911 in [place] Do not drive yourself to the hospital. MAKE SURE YOU:   Understand these instructions.  Will watch this condition.  Will get help right away if you or your child is not doing well or gets worse. Document Released: 05/08/2008 Document Revised: 02/12/2012 Document Reviewed: 05/08/2008 [facility] Patient Information 2014 [place], [place].

## 2014-05-12 NOTE — Telephone Encounter (Signed)
Pt was seen in the ER early this morning with chest pain, shortness of breath and ankle swelling.  Chest pain, that worsened with deep inspiration, started around 5 pm yesterday while pt was in flight coming back from [place].  During the onset of chest pain, pt states she was also experiencing shortness of breath, feeling faint and dizzy, and an usual hot sensation.  She also had ankle swelling which she states started Saturday and progressively got worse to the point that it became painful and difficult to walk.  She was evaluated and later discharged.  Her cardiac work up was negative, but they did note an elevated WBC.   Pt was instructed to follow up with her PCP and to take tylenol for pain.    Pt states that she continues to experience chest pain and shortness of breath.  States that her symptoms are about the same as they were whenever she was seen in the ER this morning.  Rated pain 6/10.  Pain continues to worsen with deep inspiration.  States that she feels tired, her neck hurts and it feels like "I need to stretch" my chest.    ED Follow up appointment was scheduled for tomorrow (05/13/14) at 11:30 am with Dr. Birdie Riddle.  Pt was encouraged to take prescribed medication as well as the tylenol as instructed.  Warning signs that should prompt her return to the ER was also discussed with her.  She stated understanding and agreed that she would return back to the ER if these symptoms develop.

## 2014-05-12 NOTE — ED Notes (Signed)
Discussed with the patient that BP of 91/55 with no relief, nitro will be held.

## 2014-05-12 NOTE — ED Notes (Signed)
Portable  Xray at the bedside.

## 2014-05-13 ENCOUNTER — Encounter: Payer: Self-pay | Admitting: Family Medicine

## 2014-05-13 ENCOUNTER — Ambulatory Visit (INDEPENDENT_AMBULATORY_CARE_PROVIDER_SITE_OTHER): Payer: BC Managed Care – PPO | Admitting: Family Medicine

## 2014-05-13 VITALS — BP 140/80 | HR 114 | Temp 99.4°F | Resp 17 | Wt 220.2 lb

## 2014-05-13 DIAGNOSIS — R079 Chest pain, unspecified: Secondary | ICD-10-CM

## 2014-05-13 DIAGNOSIS — R0602 Shortness of breath: Secondary | ICD-10-CM

## 2014-05-13 DIAGNOSIS — D72829 Elevated white blood cell count, unspecified: Secondary | ICD-10-CM

## 2014-05-13 NOTE — Patient Instructions (Signed)
Follow up as needed We'll notify you of your lab results and make any changes if needed We'll call you with your hematology appt Call with any questions or concerns Hang in there!!!

## 2014-05-13 NOTE — Progress Notes (Signed)
Pre visit review using our clinic review tool, if applicable. No additional management support is needed unless otherwise documented below in the visit note. 

## 2014-05-13 NOTE — Progress Notes (Signed)
   Subjective:    Patient ID: Pamela Clarke, female    DOB: 1976-02-04, 38 y.o.   MRN: [id number]  HPI ER F/U- was having feet swelling while on vacation in [place].  While on plane home, started sweating, hands and feet were swelling.  Thought about asking them to land plane.  + CP, SOB while on plane.  Pain was central, radiating to back.  Developed L sided back spasms.  Labs in ER showed elevated WBC.  Pt had 2 Nitro in ER w/ mild relief.  Fentanyl push in ER caused sleep.  Has had normal cards w/u and normal PFTs (saw 2nd pulmonologist at [facility] for 2nd opinion).  xrays in ER were normal.  Pt has persistent cough, elevated WBC, no evidence of PNA, fatigue, weight loss.     Review of Systems For ROS see HPI     Objective:   Physical Exam  Vitals reviewed. Constitutional: She is oriented to person, place, and time. She appears well-developed and well-nourished.  Tearful, obviously upset  HENT:  Head: Normocephalic and atraumatic.  Cardiovascular: Normal rate, regular rhythm, normal heart sounds and intact distal pulses.   Pulmonary/Chest: Effort normal and breath sounds normal. No respiratory distress. She has no wheezes. She has no rales.  Musculoskeletal: She exhibits no edema.  Neurological: She is alert and oriented to person, place, and time.  Skin: Skin is warm and dry.  Psychiatric:  Tearful, anxious, frustrated          Assessment & Plan:

## 2014-05-14 ENCOUNTER — Telehealth: Payer: Self-pay | Admitting: Hematology and Oncology

## 2014-05-14 LAB — CBC WITH DIFFERENTIAL/PLATELET
Basophils Absolute: 0 10*3/uL (ref 0.0–0.1)
Basophils Relative: 0.3 % (ref 0.0–3.0)
Eosinophils Absolute: 0 10*3/uL (ref 0.0–0.7)
Eosinophils Relative: 0.3 % (ref 0.0–5.0)
HCT: 34.8 % — ABNORMAL LOW (ref 36.0–46.0)
Hemoglobin: 11.1 g/dL — ABNORMAL LOW (ref 12.0–15.0)
Lymphocytes Relative: 16.5 % (ref 12.0–46.0)
Lymphs Abs: 1.9 10*3/uL (ref 0.7–4.0)
MCHC: 31.9 g/dL (ref 30.0–36.0)
MCV: 87.9 fl (ref 78.0–100.0)
MONOS PCT: 4.1 % (ref 3.0–12.0)
Monocytes Absolute: 0.5 10*3/uL (ref 0.1–1.0)
NEUTROS PCT: 78.8 % — AB (ref 43.0–77.0)
Neutro Abs: 9 10*3/uL — ABNORMAL HIGH (ref 1.4–7.7)
Platelets: 515 10*3/uL — ABNORMAL HIGH (ref 150.0–400.0)
RBC: 3.96 Mil/uL (ref 3.87–5.11)
RDW: 12.4 % (ref 11.5–15.5)
WBC: 11.4 10*3/uL — ABNORMAL HIGH (ref 4.0–10.5)

## 2014-05-14 NOTE — Telephone Encounter (Signed)
LEFT MESSAGE FOR PATIENT TO RETURN CALL TO SCHEDULE NP APPT.  °

## 2014-05-17 NOTE — Assessment & Plan Note (Signed)
Ongoing problem for pt.  Has had normal cardiac and pulm w/u.  Has rheum referral pending.  Pt very clear that something is wrong and that she does not feel right.  Do not doubt pt or her symptoms but I am unclear as to how to proceed at this point.

## 2014-05-17 NOTE — Assessment & Plan Note (Signed)
New.  Noted on labs done in ER.  Repeat labs today.  Due to night sweats, weight loss, CP, SOB, and now elevated WBC- concerned for possible occult malignancy despite no LAD on CT scan done in April.  Will refer to heme.

## 2014-05-17 NOTE — Assessment & Plan Note (Signed)
Ongoing issue for pt.  Normal pulmonary w/u thus far.  Has rheumatology evaluation pending.  No obvious cause for pt's sxs at this time.  Will continue to investigate.

## 2014-05-18 ENCOUNTER — Other Ambulatory Visit: Payer: Self-pay | Admitting: General Practice

## 2014-05-18 MED ORDER — FLUOXETINE HCL 20 MG PO TABS: 20.0000 mg | ORAL_TABLET | Freq: Every day | ORAL | Status: DC

## 2014-05-21 ENCOUNTER — Other Ambulatory Visit: Payer: Self-pay

## 2014-05-21 DIAGNOSIS — R0602 Shortness of breath: Secondary | ICD-10-CM

## 2014-05-21 DIAGNOSIS — R079 Chest pain, unspecified: Secondary | ICD-10-CM

## 2014-05-21 MED ORDER — HYDROCHLOROTHIAZIDE 25 MG PO TABS: 25.0000 mg | ORAL_TABLET | Freq: Every day | ORAL | Status: DC

## 2014-05-21 MED ORDER — AMLODIPINE BESYLATE 10 MG PO TABS: 10.0000 mg | ORAL_TABLET | Freq: Every day | ORAL | Status: DC

## 2014-05-27 ENCOUNTER — Other Ambulatory Visit: Payer: Self-pay | Admitting: General Practice

## 2014-05-27 ENCOUNTER — Other Ambulatory Visit: Payer: Self-pay | Admitting: Family Medicine

## 2014-05-27 MED ORDER — OMEPRAZOLE 20 MG PO CPDR: 20.0000 mg | DELAYED_RELEASE_CAPSULE | Freq: Every day | ORAL | Status: DC

## 2014-05-28 NOTE — Telephone Encounter (Signed)
Med filled.  

## 2014-06-02 ENCOUNTER — Telehealth: Payer: Self-pay | Admitting: Hematology and Oncology

## 2014-06-02 ENCOUNTER — Ambulatory Visit (HOSPITAL_BASED_OUTPATIENT_CLINIC_OR_DEPARTMENT_OTHER): Payer: BC Managed Care – PPO | Admitting: Hematology and Oncology

## 2014-06-02 ENCOUNTER — Ambulatory Visit: Payer: BC Managed Care – PPO

## 2014-06-02 ENCOUNTER — Encounter: Payer: Self-pay | Admitting: Hematology and Oncology

## 2014-06-02 ENCOUNTER — Ambulatory Visit (HOSPITAL_BASED_OUTPATIENT_CLINIC_OR_DEPARTMENT_OTHER): Payer: BC Managed Care – PPO

## 2014-06-02 ENCOUNTER — Other Ambulatory Visit: Payer: Self-pay | Admitting: Hematology and Oncology

## 2014-06-02 ENCOUNTER — Telehealth: Payer: Self-pay | Admitting: Nurse Practitioner

## 2014-06-02 VITALS — BP 136/88 | HR 102 | Temp 98.0°F | Resp 18 | Ht 63.0 in | Wt 217.8 lb

## 2014-06-02 DIAGNOSIS — D75839 Thrombocytosis, unspecified: Secondary | ICD-10-CM

## 2014-06-02 DIAGNOSIS — D539 Nutritional anemia, unspecified: Secondary | ICD-10-CM

## 2014-06-02 DIAGNOSIS — E876 Hypokalemia: Secondary | ICD-10-CM

## 2014-06-02 DIAGNOSIS — D72829 Elevated white blood cell count, unspecified: Secondary | ICD-10-CM

## 2014-06-02 DIAGNOSIS — D473 Essential (hemorrhagic) thrombocythemia: Secondary | ICD-10-CM

## 2014-06-02 DIAGNOSIS — D649 Anemia, unspecified: Secondary | ICD-10-CM | POA: Insufficient documentation

## 2014-06-02 HISTORY — DX: Thrombocytosis, unspecified: D75.839

## 2014-06-02 HISTORY — DX: Nutritional anemia, unspecified: D53.9

## 2014-06-02 HISTORY — DX: Hypokalemia: E87.6

## 2014-06-02 LAB — COMPREHENSIVE METABOLIC PANEL (CC13)
ALT: 65 U/L — AB (ref 0–55)
ANION GAP: 11 meq/L (ref 3–11)
AST: 42 U/L — ABNORMAL HIGH (ref 5–34)
Albumin: 3.4 g/dL — ABNORMAL LOW (ref 3.5–5.0)
Alkaline Phosphatase: 132 U/L (ref 40–150)
BUN: 10.4 mg/dL (ref 7.0–26.0)
CO2: 27 meq/L (ref 22–29)
Calcium: 9.4 mg/dL (ref 8.4–10.4)
Chloride: 101 mEq/L (ref 98–109)
Creatinine: 1 mg/dL (ref 0.6–1.1)
Glucose: 98 mg/dl (ref 70–140)
Potassium: 3 mEq/L — CL (ref 3.5–5.1)
SODIUM: 139 meq/L (ref 136–145)
TOTAL PROTEIN: 7.5 g/dL (ref 6.4–8.3)
Total Bilirubin: 0.64 mg/dL (ref 0.20–1.20)

## 2014-06-02 LAB — CBC & DIFF AND RETIC
BASO%: 0.3 % (ref 0.0–2.0)
Basophils Absolute: 0 10*3/uL (ref 0.0–0.1)
EOS%: 0.3 % (ref 0.0–7.0)
Eosinophils Absolute: 0 10*3/uL (ref 0.0–0.5)
HEMATOCRIT: 33.2 % — AB (ref 34.8–46.6)
HGB: 10.9 g/dL — ABNORMAL LOW (ref 11.6–15.9)
IMMATURE RETIC FRACT: 2.9 % (ref 1.60–10.00)
LYMPH#: 1.5 10*3/uL (ref 0.9–3.3)
LYMPH%: 14.4 % (ref 14.0–49.7)
MCH: 27.8 pg (ref 25.1–34.0)
MCHC: 32.8 g/dL (ref 31.5–36.0)
MCV: 84.7 fL (ref 79.5–101.0)
MONO#: 0.5 10*3/uL (ref 0.1–0.9)
MONO%: 5.1 % (ref 0.0–14.0)
NEUT#: 8.4 10*3/uL — ABNORMAL HIGH (ref 1.5–6.5)
NEUT%: 79.9 % — AB (ref 38.4–76.8)
Platelets: 472 10*3/uL — ABNORMAL HIGH (ref 145–400)
RBC: 3.92 10*6/uL (ref 3.70–5.45)
RDW: 11.9 % (ref 11.2–14.5)
Retic %: 2.01 % (ref 0.70–2.10)
Retic Ct Abs: 78.79 10*3/uL (ref 33.70–90.70)
WBC: 10.5 10*3/uL — ABNORMAL HIGH (ref 3.9–10.3)

## 2014-06-02 LAB — CHCC SMEAR

## 2014-06-02 LAB — MORPHOLOGY
PLT EST: INCREASED
RBC Comments: NORMAL

## 2014-06-02 LAB — LACTATE DEHYDROGENASE (CC13): LDH: 145 U/L (ref 125–245)

## 2014-06-02 NOTE — Telephone Encounter (Signed)
gv adn printed apt sched and avs for pt for July...sent pt to lab

## 2014-06-02 NOTE — Progress Notes (Signed)
[place] NOTE  Patient Care Team: [name], MD as PCP - General (Family Medicine)  CHIEF COMPLAINTS/PURPOSE OF CONSULTATION:  Chronic anemia, leukocytosis and thrombocytosis  HISTORY OF PRESENTING ILLNESS:  Pamela Clarke 38 y.o. female is here because of abnormal WBC.  She was found to have abnormal CBC from routine blood work. She has been feeling unwell with nonspecific shortness of breath, chest pain, fatigue, anorexia and 15 pound weight loss over the past 1-1/2 months. She also has some frequent night sweats. She has dry, nonproductive cough over the last 2 months of unknown etiology. She had passage of blood in his stool on a regular basis every week over the past few years. She had colonoscopy done several years ago which show only colon polyps. She also complained of back spasm. CT angiogram dated 03/13/2014 show no evidence of pneumonia or PE. 2 nonspecific lung nodules were found. Echocardiogram dated 04/02/2014 show normal ejection fraction. Pulmonary function testing done on 04/23/2014 were unremarkable. She denies recent infection. The last prescription antibiotics was more than 3 months ago There is not reported symptoms of sinus congestion, urinary frequency/urgency or dysuria, diarrhea, joint swelling/pain or abnormal skin rash.  She had no prior history or diagnosis of cancer. Her age appropriate screening programs are up-to-date. The patient has no prior diagnosis of autoimmune disease and was not prescribed corticosteroids related products. The patient denies any recent signs or symptoms of bleeding such as spontaneous epistaxis, hematuria or hematochezia. She eats a regular diet. Denies any pica. She complained of some leg spasms. Denies heavy menstruation. She denies diagnosis of blood clots.  MEDICAL HISTORY:  Past Medical History  Diagnosis Date  . Arthritis   . Blood in stool   . Heart murmur   . Obesity   . Hypertension      history of this, no meds  . Fatty liver   . Hyperplastic colon polyp 10/22/12  . Anxiety   . Thrombocytosis 06/02/2014  . Unspecified deficiency anemia 06/02/2014  . Hypokalemia 06/02/2014    SURGICAL HISTORY: Past Surgical History  Procedure Laterality Date  . Knee surgery  '3 23 13    ' right  . Knee arthroscopy      left  . Breast reduction surgery  10/19/08    SOCIAL HISTORY: History   Social History  . Marital Status: Single    Spouse Name: N/A    Number of Children: 0  . Years of Education: N/A   Occupational History  . pharmacy tech    Social History Main Topics  . Smoking status: Never Smoker   . Smokeless tobacco: Never Used  . Alcohol Use: Yes     Comment: occasionally wine 1-2 glasses q2 weeks  . Drug Use: No  . Sexual Activity: Yes    Birth Control/ Protection: None   Other Topics Concern  . Not on file   Social History Narrative   Lives w/ female partner    FAMILY HISTORY: Family History  Problem Relation Age of Onset  . Stroke Father   . Hypertension Father   . Diabetes Father   . Hypertension Maternal Grandmother   . Stroke Maternal Grandmother   . Ovarian cancer Paternal Grandmother   . Colon cancer Neg Hx   . Heart disease Paternal Grandfather   . Heart disease Maternal Grandmother   . Stroke Paternal Grandfather     ALLERGIES:  has No Known Allergies.  MEDICATIONS:  Current Outpatient Prescriptions  Medication Sig Dispense  Refill  . amLODipine (NORVASC) 10 MG tablet Take 1 tablet (10 mg total) by mouth daily.  90 tablet  1  . FLUoxetine (PROZAC) 20 MG tablet Take 1 tablet (20 mg total) by mouth daily.  90 tablet  0  . hydrochlorothiazide (HYDRODIURIL) 25 MG tablet Take 1 tablet (25 mg total) by mouth daily.  90 tablet  1  . losartan (COZAAR) 25 MG tablet Take 1 tablet (25 mg total) by mouth daily.  90 tablet  3  . Multiple Vitamin (MULTIVITAMIN) tablet Take 1 tablet by mouth daily.      Marland Kitchen omeprazole (PRILOSEC) 20 MG capsule Take  1 capsule (20 mg total) by mouth daily.  90 capsule  1  . SPRINTEC 28 0.25-35 MG-MCG tablet TAKE 1 TABLET BY MOUTH DAILY. SKIP PLACEBO PILLS AND GO RIGHT INTO NEXT PACK  112 tablet  3  . vitamin B-12 (CYANOCOBALAMIN) 500 MCG tablet Take 500 mcg by mouth daily. 1 every 3 days       No current facility-administered medications for this visit.    REVIEW OF SYSTEMS:   Constitutional: Denies fevers, chills Eyes: Denies blurriness of vision, double vision or watery eyes Ears, nose, mouth, throat, and face: Denies mucositis or sore throat Cardiovascular: Denies palpitation, chest discomfort or lower extremity swelling Gastrointestinal:  Denies nausea, heartburn or change in bowel habits Skin: Denies abnormal skin rashes Lymphatics: Denies new lymphadenopathy or easy bruising Neurological:Denies numbness, tingling or new weaknesses Behavioral/Psych: Mood is stable, no new changes  All other systems were reviewed with the patient and are negative.  PHYSICAL EXAMINATION: ECOG PERFORMANCE STATUS: 1 - Symptomatic but completely ambulatory  Filed Vitals:   06/02/14 1000  BP: 136/88  Pulse: 102  Temp: 98 F (36.7 C)  Resp: 18   Filed Weights   06/02/14 1000  Weight: 217 lb 12.8 oz (98.793 kg)    GENERAL:alert, no distress and comfortable SKIN: skin color, texture, turgor are normal, no rashes or significant lesions EYES: normal, conjunctiva are pink and non-injected, sclera clear OROPHARYNX:no exudate, no erythema and lips, buccal mucosa, and tongue normal  NECK: supple, thyroid normal size, non-tender, without nodularity LYMPH:  no palpable lymphadenopathy in the cervical, axillary or inguinal LUNGS: clear to auscultation and percussion with normal breathing effort HEART: regular rate & rhythm and no murmurs and no lower extremity edema ABDOMEN:abdomen soft, non-tender and normal bowel sounds. No splenomegaly Musculoskeletal:no cyanosis of digits and no clubbing  PSYCH: alert &  oriented x 3 with fluent speech NEURO: no focal motor/sensory deficits  LABORATORY DATA:  I have reviewed the data as listed Recent Results (from the past 2160 hour(s))  BASIC METABOLIC PANEL     Status: None   Collection Time    03/13/14 10:38 AM      Result Value Ref Range   Sodium 135  135 - 145 mEq/L   Potassium 3.8  3.5 - 5.1 mEq/L   Chloride 103  96 - 112 mEq/L   CO2 26  19 - 32 mEq/L   Glucose, Bld 87  70 - 99 mg/dL   BUN 14  6 - 23 mg/dL   Creatinine, Ser 1.0  0.4 - 1.2 mg/dL   Calcium 9.0  8.4 - 10.5 mg/dL   GFR 84.91  >60.00 mL/min  CBC WITH DIFFERENTIAL     Status: Abnormal   Collection Time    03/13/14 10:38 AM      Result Value Ref Range   WBC 9.0  4.5 - 10.5  K/uL   RBC 3.73 (*) 3.87 - 5.11 Mil/uL   Hemoglobin 10.6 (*) 12.0 - 15.0 g/dL   HCT 32.8 (*) 36.0 - 46.0 %   MCV 88.0  78.0 - 100.0 fl   MCHC 32.3  30.0 - 36.0 g/dL   RDW 12.2  11.5 - 14.6 %   Platelets 448.0 (*) 150.0 - 400.0 K/uL   Neutrophils Relative % 69.8  43.0 - 77.0 %   Lymphocytes Relative 22.8  12.0 - 46.0 %   Monocytes Relative 6.2  3.0 - 12.0 %   Eosinophils Relative 0.8  0.0 - 5.0 %   Basophils Relative 0.4  0.0 - 3.0 %   Neutro Abs 6.3  1.4 - 7.7 K/uL   Lymphs Abs 2.1  0.7 - 4.0 K/uL   Monocytes Absolute 0.6  0.1 - 1.0 K/uL   Eosinophils Absolute 0.1  0.0 - 0.7 K/uL   Basophils Absolute 0.0  0.0 - 0.1 K/uL  TSH     Status: None   Collection Time    03/13/14 10:38 AM      Result Value Ref Range   TSH 1.65  0.35 - 5.50 uIU/mL  D-DIMER, QUANTITATIVE     Status: Abnormal   Collection Time    03/13/14 10:38 AM      Result Value Ref Range   D-Dimer, Quant 1.44 (*) 0.00 - 0.48 ug/mL-FEU   Comment: At the inhouse established cutoff value of 0.48 ug/mL FEU, this     methology has been documented in the literature to have a sensitivity     and negative predictive value of at least 98-99%.  The test result     should be correlated with an assessment of the clinical probability of      DVT/VTE.  FERRITIN     Status: None   Collection Time    03/24/14  2:17 PM      Result Value Ref Range   Ferritin 94.0  10.0 - 291.0 ng/mL  IRON     Status: None   Collection Time    03/24/14  2:17 PM      Result Value Ref Range   Iron 73  42 - 145 ug/dL  VITAMIN B12     Status: None   Collection Time    03/24/14  2:17 PM      Result Value Ref Range   Vitamin B-12 293  211 - 911 pg/mL  FOLATE     Status: None   Collection Time    03/24/14  2:17 PM      Result Value Ref Range   Folate >24.8  >5.9 ng/mL  HEMOGLOBIN     Status: Abnormal   Collection Time    03/24/14  2:17 PM      Result Value Ref Range   Hemoglobin 11.0 (*) 12.0 - 15.0 g/dL  PRO B NATRIURETIC PEPTIDE     Status: None   Collection Time    03/24/14 10:26 PM      Result Value Ref Range   Pro B Natriuretic peptide (BNP) 110.4  0 - 125 pg/mL  CBC WITH DIFFERENTIAL     Status: Abnormal   Collection Time    03/24/14 10:27 PM      Result Value Ref Range   WBC 13.0 (*) 4.0 - 10.5 K/uL   RBC 3.77 (*) 3.87 - 5.11 MIL/uL   Hemoglobin 10.5 (*) 12.0 - 15.0 g/dL   HCT 32.9 (*) 36.0 - 46.0 %  MCV 87.3  78.0 - 100.0 fL   MCH 27.9  26.0 - 34.0 pg   MCHC 31.9  30.0 - 36.0 g/dL   RDW 12.0  11.5 - 15.5 %   Platelets 430 (*) 150 - 400 K/uL   Neutrophils Relative % 62  43 - 77 %   Neutro Abs 8.0 (*) 1.7 - 7.7 K/uL   Lymphocytes Relative 32  12 - 46 %   Lymphs Abs 4.2 (*) 0.7 - 4.0 K/uL   Monocytes Relative 5  3 - 12 %   Monocytes Absolute 0.7  0.1 - 1.0 K/uL   Eosinophils Relative 1  0 - 5 %   Eosinophils Absolute 0.1  0.0 - 0.7 K/uL   Basophils Relative 0  0 - 1 %   Basophils Absolute 0.0  0.0 - 0.1 K/uL  BASIC METABOLIC PANEL     Status: Abnormal   Collection Time    03/24/14 10:27 PM      Result Value Ref Range   Sodium 139  137 - 147 mEq/L   Potassium 4.0  3.7 - 5.3 mEq/L   Chloride 103  96 - 112 mEq/L   CO2 23  19 - 32 mEq/L   Glucose, Bld 87  70 - 99 mg/dL   BUN 11  6 - 23 mg/dL   Creatinine, Ser 0.96   0.50 - 1.10 mg/dL   Calcium 9.3  8.4 - 10.5 mg/dL   GFR calc non Af Amer 75 (*) >90 mL/min   GFR calc Af Amer 87 (*) >90 mL/min   Comment: (NOTE)     The eGFR has been calculated using the CKD EPI equation.     This calculation has not been validated in all clinical situations.     eGFR's persistently <90 mL/min signify possible Chronic Kidney     Disease.  Randolm Idol, ED     Status: None   Collection Time    03/24/14 10:33 PM      Result Value Ref Range   Troponin i, poc 0.00  0.00 - 0.08 ng/mL   Comment 3            Comment: Due to the release kinetics of cTnI,     a negative result within the first hours     of the onset of symptoms does not rule out     myocardial infarction with certainty.     If myocardial infarction is still suspected,     repeat the test at appropriate intervals.  URINALYSIS, ROUTINE W REFLEX MICROSCOPIC     Status: Abnormal   Collection Time    03/25/14 12:09 AM      Result Value Ref Range   Color, Urine YELLOW  YELLOW   APPearance CLEAR  CLEAR   Specific Gravity, Urine 1.031 (*) 1.005 - 1.030   pH 5.5  5.0 - 8.0   Glucose, UA NEGATIVE  NEGATIVE mg/dL   Hgb urine dipstick NEGATIVE  NEGATIVE   Bilirubin Urine NEGATIVE  NEGATIVE   Ketones, ur NEGATIVE  NEGATIVE mg/dL   Protein, ur NEGATIVE  NEGATIVE mg/dL   Urobilinogen, UA 1.0  0.0 - 1.0 mg/dL   Nitrite NEGATIVE  NEGATIVE   Leukocytes, UA NEGATIVE  NEGATIVE   Comment: MICROSCOPIC NOT DONE ON URINES WITH NEGATIVE PROTEIN, BLOOD, LEUKOCYTES, NITRITE, OR GLUCOSE <1000 mg/dL.  POC URINE PREG, ED     Status: None   Collection Time    03/25/14 12:21 AM  Result Value Ref Range   Preg Test, Ur NEGATIVE  NEGATIVE   Comment:            THE SENSITIVITY OF THIS     METHODOLOGY IS >24 mIU/mL  PULMONARY FUNCTION TEST     Status: None   Collection Time    04/23/14  3:47 PM      Result Value Ref Range   FVC-Pre 2.97     FVC-%Pred-Pre 97     FVC-Post 3.02     FVC-%Pred-Post 98      FVC-%Change-Post 1     FEV1-Pre 2.52     FEV1-%Pred-Pre 98     FEV1-Post 2.65     FEV1-%Pred-Post 103     FEV1-%Change-Post 5     FEV6-Pre 2.96     FEV6-%Pred-Pre 98     FEV6-Post 3.01     FEV6-%Pred-Post 99     FEV6-%Change-Post 1     Pre FEV1/FVC ratio 85     FEV1FVC-%Pred-Pre 100     Post FEV1/FVC ratio 88     FEV1FVC-%Change-Post 3     Pre FEV6/FVC Ratio 99     FEV6FVC-%Pred-Pre 101     Post FEV6/FVC ratio 100     FEV6FVC-%Pred-Post 101     FEV6FVC-%Change-Post 0     FEF 25-75 Pre 2.79     FEF2575-%Pred-Pre 96     FEF 25-75 Post 3.79     FEF2575-%Pred-Post 131     FEF2575-%Change-Post 35     RV 1.76     RV % pred 116     TLC 4.84     TLC % pred 97     DLCO unc 20.83     DLCO unc % pred 88     DL/VA 5.14     DL/VA % pred 108    SEDIMENTATION RATE     Status: Abnormal   Collection Time    04/30/14  4:24 PM      Result Value Ref Range   Sed Rate 32 (*) 0 - 22 mm/hr  ANA     Status: None   Collection Time    04/30/14  4:24 PM      Result Value Ref Range   ANA NEG  NEGATIVE  CBC     Status: Abnormal   Collection Time    05/12/14 12:19 AM      Result Value Ref Range   WBC 16.3 (*) 4.0 - 10.5 K/uL   RBC 3.79 (*) 3.87 - 5.11 MIL/uL   Hemoglobin 10.4 (*) 12.0 - 15.0 g/dL   HCT 32.2 (*) 36.0 - 46.0 %   MCV 85.0  78.0 - 100.0 fL   MCH 27.4  26.0 - 34.0 pg   MCHC 32.3  30.0 - 36.0 g/dL   RDW 11.8  11.5 - 15.5 %   Platelets 513 (*) 150 - 400 K/uL  BASIC METABOLIC PANEL     Status: Abnormal   Collection Time    05/12/14 12:19 AM      Result Value Ref Range   Sodium 138  137 - 147 mEq/L   Potassium 3.4 (*) 3.7 - 5.3 mEq/L   Chloride 96  96 - 112 mEq/L   CO2 27  19 - 32 mEq/L   Glucose, Bld 106 (*) 70 - 99 mg/dL   BUN 13  6 - 23 mg/dL   Creatinine, Ser 0.90  0.50 - 1.10 mg/dL   Calcium 9.5  8.4 - 10.5 mg/dL   GFR  calc non Af Amer 81 (*) >90 mL/min   GFR calc Af Amer >90  >90 mL/min   Comment: (NOTE)     The eGFR has been calculated using the CKD EPI  equation.     This calculation has not been validated in all clinical situations.     eGFR's persistently <90 mL/min signify possible Chronic Kidney     Disease.  Randolm Idol, ED     Status: None   Collection Time    05/12/14 12:30 AM      Result Value Ref Range   Troponin i, poc 0.00  0.00 - 0.08 ng/mL   Comment 3            Comment: Due to the release kinetics of cTnI,     a negative result within the first hours     of the onset of symptoms does not rule out     myocardial infarction with certainty.     If myocardial infarction is still suspected,     repeat the test at appropriate intervals.  D-DIMER, QUANTITATIVE     Status: None   Collection Time    05/12/14  4:47 AM      Result Value Ref Range   D-Dimer, Quant <0.27  0.00 - 0.48 ug/mL-FEU   Comment:            AT THE INHOUSE ESTABLISHED CUTOFF     VALUE OF 0.48 ug/mL FEU,     THIS ASSAY HAS BEEN DOCUMENTED     IN THE LITERATURE TO HAVE     A SENSITIVITY AND NEGATIVE     PREDICTIVE VALUE OF AT LEAST     98 TO 99%.  THE TEST RESULT     SHOULD BE CORRELATED WITH     AN ASSESSMENT OF THE CLINICAL     PROBABILITY OF DVT / VTE.  Randolm Idol, ED     Status: None   Collection Time    05/12/14  4:57 AM      Result Value Ref Range   Troponin i, poc 0.00  0.00 - 0.08 ng/mL   Comment 3            Comment: Due to the release kinetics of cTnI,     a negative result within the first hours     of the onset of symptoms does not rule out     myocardial infarction with certainty.     If myocardial infarction is still suspected,     repeat the test at appropriate intervals.  CBC WITH DIFFERENTIAL     Status: Abnormal   Collection Time    05/13/14 12:10 PM      Result Value Ref Range   WBC 11.4 (*) 4.0 - 10.5 K/uL   RBC 3.96  3.87 - 5.11 Mil/uL   Hemoglobin 11.1 (*) 12.0 - 15.0 g/dL   HCT 34.8 (*) 36.0 - 46.0 %   MCV 87.9  78.0 - 100.0 fl   MCHC 31.9  30.0 - 36.0 g/dL   RDW 12.4  11.5 - 15.5 %   Platelets 515.0 (*)  150.0 - 400.0 K/uL   Neutrophils Relative % 78.8 (*) 43.0 - 77.0 %   Lymphocytes Relative 16.5  12.0 - 46.0 %   Monocytes Relative 4.1  3.0 - 12.0 %   Eosinophils Relative 0.3  0.0 - 5.0 %   Basophils Relative 0.3  0.0 - 3.0 %   Neutro Abs 9.0 (*) 1.4 - 7.7  K/uL   Lymphs Abs 1.9  0.7 - 4.0 K/uL   Monocytes Absolute 0.5  0.1 - 1.0 K/uL   Eosinophils Absolute 0.0  0.0 - 0.7 K/uL   Basophils Absolute 0.0  0.0 - 0.1 K/uL  COMPREHENSIVE METABOLIC PANEL (VF64)     Status: Abnormal   Collection Time    06/02/14 11:03 AM      Result Value Ref Range   Sodium 139  136 - 145 mEq/L   Potassium 3.0 (*) 3.5 - 5.1 mEq/L   Chloride 101  98 - 109 mEq/L   CO2 27  22 - 29 mEq/L   Glucose 98  70 - 140 mg/dl   BUN 10.4  7.0 - 26.0 mg/dL   Creatinine 1.0  0.6 - 1.1 mg/dL   Total Bilirubin 0.64  0.20 - 1.20 mg/dL   Alkaline Phosphatase 132  40 - 150 U/L   AST 42 (*) 5 - 34 U/L   ALT 65 (*) 0 - 55 U/L   Total Protein 7.5  6.4 - 8.3 g/dL   Albumin 3.4 (*) 3.5 - 5.0 g/dL   Calcium 9.4  8.4 - 10.4 mg/dL   Anion Gap 11  3 - 11 mEq/L  CBC & DIFF AND RETIC     Status: Abnormal   Collection Time    06/02/14 11:03 AM      Result Value Ref Range   WBC 10.5 (*) 3.9 - 10.3 10e3/uL   NEUT# 8.4 (*) 1.5 - 6.5 10e3/uL   HGB 10.9 (*) 11.6 - 15.9 g/dL   HCT 33.2 (*) 34.8 - 46.6 %   Platelets 472 (*) 145 - 400 10e3/uL   MCV 84.7  79.5 - 101.0 fL   MCH 27.8  25.1 - 34.0 pg   MCHC 32.8  31.5 - 36.0 g/dL   RBC 3.92  3.70 - 5.45 10e6/uL   RDW 11.9  11.2 - 14.5 %   lymph# 1.5  0.9 - 3.3 10e3/uL   MONO# 0.5  0.1 - 0.9 10e3/uL   Eosinophils Absolute 0.0  0.0 - 0.5 10e3/uL   Basophils Absolute 0.0  0.0 - 0.1 10e3/uL   NEUT% 79.9 (*) 38.4 - 76.8 %   LYMPH% 14.4  14.0 - 49.7 %   MONO% 5.1  0.0 - 14.0 %   EOS% 0.3  0.0 - 7.0 %   BASO% 0.3  0.0 - 2.0 %   Retic % 2.01  0.70 - 2.10 %   Retic Ct Abs 78.79  33.70 - 90.70 10e3/uL   Immature Retic Fract 2.90  1.60 - 10.00 %  LACTATE DEHYDROGENASE (CC13)     Status: None    Collection Time    06/02/14 11:03 AM      Result Value Ref Range   LDH 145  125 - 245 U/L  MORPHOLOGY     Status: None   Collection Time    06/02/14 11:03 AM      Result Value Ref Range   RBC Comments Within Normal Limits  Within Normal Limits   White Cell Comments C/W auto diff     PLT EST Increased  Adequate  [facility] SMEAR     Status: None   Collection Time    06/02/14 11:03 AM      Result Value Ref Range   Smear Result Smear Available      RADIOGRAPHIC STUDIES: I reviewed her last CT scan in April and agree with the interpretation.  ASSESSMENT & PLAN #1 Leukocytosis #  2 thrombocytosis #3 chronic anemia The abnormal CBC likely related to reactive process. I will order an additional workup to exclude myeloproliferative disorder. #4 hypokalemia I will hold her diuretics and recheck on her return visit  Spent total 45 minutes on counseling and review of test results.

## 2014-06-02 NOTE — Progress Notes (Signed)
Checked in new patient with no financial issues and have not seen the dr yet. She has not been out of the country and has her appt card.

## 2014-06-02 NOTE — Telephone Encounter (Signed)
s.w. pt and advised on lab b4 visit on 7.14.Marland KitchenMarland Kitchenpt ok adn aware

## 2014-06-02 NOTE — Telephone Encounter (Signed)
Per MD, patient notified of low K 3.0 today. Patient instructed to stop hydrochlorothiazide and increase potassium in diet such as by eating bananas. Pt informed we will monitor BP and recheck labs at next appointment. Patient verbalizes understanding of MD's instructions and thanked for the call.

## 2014-06-04 LAB — DIRECT ANTIGLOBULIN TEST (NOT AT ARMC)
DAT (COMPLEMENT): NEGATIVE
DAT IgG: NEGATIVE

## 2014-06-04 LAB — HEMOGLOBINOPATHY EVALUATION
HGB A2 QUANT: 1.3 % — AB (ref 2.2–3.2)
HGB F QUANT: 0 % (ref 0.0–2.0)
HGB S QUANTITAION: 0 %
Hemoglobin Other: 0 %
Hgb A: 98.7 % — ABNORMAL HIGH (ref 96.8–97.8)

## 2014-06-04 LAB — C-REACTIVE PROTEIN: CRP: 1.3 mg/dL — ABNORMAL HIGH (ref <0.60)

## 2014-06-16 ENCOUNTER — Telehealth: Payer: Self-pay | Admitting: Hematology and Oncology

## 2014-06-16 ENCOUNTER — Ambulatory Visit (HOSPITAL_BASED_OUTPATIENT_CLINIC_OR_DEPARTMENT_OTHER): Payer: BC Managed Care – PPO | Admitting: Hematology and Oncology

## 2014-06-16 ENCOUNTER — Telehealth: Payer: Self-pay | Admitting: *Deleted

## 2014-06-16 ENCOUNTER — Other Ambulatory Visit (HOSPITAL_BASED_OUTPATIENT_CLINIC_OR_DEPARTMENT_OTHER): Payer: BC Managed Care – PPO

## 2014-06-16 ENCOUNTER — Encounter: Payer: Self-pay | Admitting: Hematology and Oncology

## 2014-06-16 VITALS — BP 125/82 | HR 76 | Temp 98.4°F | Resp 18 | Ht 63.0 in | Wt 221.7 lb

## 2014-06-16 DIAGNOSIS — K921 Melena: Secondary | ICD-10-CM

## 2014-06-16 DIAGNOSIS — D75839 Thrombocytosis, unspecified: Secondary | ICD-10-CM

## 2014-06-16 DIAGNOSIS — E876 Hypokalemia: Secondary | ICD-10-CM

## 2014-06-16 DIAGNOSIS — R509 Fever, unspecified: Secondary | ICD-10-CM | POA: Insufficient documentation

## 2014-06-16 DIAGNOSIS — D72829 Elevated white blood cell count, unspecified: Secondary | ICD-10-CM

## 2014-06-16 DIAGNOSIS — D473 Essential (hemorrhagic) thrombocythemia: Secondary | ICD-10-CM

## 2014-06-16 DIAGNOSIS — K625 Hemorrhage of anus and rectum: Secondary | ICD-10-CM

## 2014-06-16 DIAGNOSIS — D539 Nutritional anemia, unspecified: Secondary | ICD-10-CM

## 2014-06-16 LAB — POTASSIUM (CC13): Potassium: 4.1 mEq/L (ref 3.5–5.1)

## 2014-06-16 NOTE — Assessment & Plan Note (Signed)
I told her to restart the hydrochlorothiazide if needed. Since we held her treatment, her potassium level has normalized.

## 2014-06-16 NOTE — Progress Notes (Signed)
[place] OFFICE PROGRESS NOTE  Pamela Asa, MD  SUMMARY OF HEMATOLOGIC HISTORY:  She was found to have abnormal CBC from routine blood work. She has been feeling unwell with nonspecific shortness of breath, chest pain, fatigue, anorexia and 15 pound weight loss over the past 1-1/2 months. She also has some frequent night sweats. She has dry, nonproductive cough over the last 2 months of unknown etiology. She had passage of blood in his stool on a regular basis every week over the past few years. She had colonoscopy done several years ago which show only colon polyps. She also complained of back spasm. CT angiogram dated 03/13/2014 show no evidence of pneumonia or PE. 2 nonspecific lung nodules were found. Echocardiogram dated 04/02/2014 show normal ejection fraction. Pulmonary function testing done on 04/23/2014 were unremarkable. Blood work for BCR/ABL, JAK2 mutation and MPL were negative. INTERVAL HISTORY: Pamela Clarke 38 y.o. female returns for further followup. Since discontinuation of her diuretic, she complained of mild leg edema. She still feels fatigued with regular low-grade fever and sweats.  I have reviewed the past medical history, past surgical history, social history and family history with the patient and they are unchanged from previous note.  ALLERGIES:  has No Known Allergies.  MEDICATIONS:  Current Outpatient Prescriptions  Medication Sig Dispense Refill  . amLODipine (NORVASC) 10 MG tablet Take 1 tablet (10 mg total) by mouth daily.  90 tablet  1  . FLUoxetine (PROZAC) 20 MG tablet Take 1 tablet (20 mg total) by mouth daily.  90 tablet  0  . losartan (COZAAR) 25 MG tablet Take 1 tablet (25 mg total) by mouth daily.  90 tablet  3  . Multiple Vitamin (MULTIVITAMIN) tablet Take 1 tablet by mouth daily.      Marland Kitchen omeprazole (PRILOSEC) 20 MG capsule Take 1 capsule (20 mg total) by mouth daily.  90 capsule  1  . SPRINTEC 28 0.25-35 MG-MCG tablet  TAKE 1 TABLET BY MOUTH DAILY. SKIP PLACEBO PILLS AND GO RIGHT INTO NEXT PACK  112 tablet  3  . vitamin B-12 (CYANOCOBALAMIN) 500 MCG tablet Take 500 mcg by mouth daily. 1 every 3 days       No current facility-administered medications for this visit.     REVIEW OF SYSTEMS:   All other systems were reviewed with the patient and are negative.  PHYSICAL EXAMINATION: ECOG PERFORMANCE STATUS: 1 - Symptomatic but completely ambulatory  Filed Vitals:   06/16/14 1027  BP: 125/82  Pulse: 76  Temp: 98.4 F (36.9 C)  Resp: 18   Filed Weights   06/16/14 1027  Weight: 221 lb 11.2 oz (100.562 kg)    GENERAL:alert, no distress and comfortable SKIN: skin color, texture, turgor are normal, no rashes or significant lesions EYES: normal, Conjunctiva are pink and non-injected, sclera clear Musculoskeletal:no cyanosis of digits and no clubbing  NEURO: alert & oriented x 3 with fluent speech, no focal motor/sensory deficits  LABORATORY DATA:  I have reviewed the data as listed Results for orders placed in visit on 06/16/14 (from the past 48 hour(s))  POTASSIUM (CC13)     Status: None   Collection Time    06/16/14 10:18 AM      Result Value Ref Range   Potassium 4.1  3.5 - 5.1 mEq/L    Lab Results  Component Value Date   WBC 10.5* 06/02/2014   HGB 10.9* 06/02/2014   HCT 33.2* 06/02/2014   MCV 84.7 06/02/2014   PLT 472*  06/02/2014    ASSESSMENT & PLAN:  Unspecified deficiency anemia The cause is unknown. There could be a component of anemia chronic disease. Recommend observation only.  Thrombocytosis Extensive workup did not show anything to suggest myeloproliferative disorder. Discussed with her the risk and benefit of bone marrow aspirate and biopsy. This certainly could be reactive in nature. I recommend 81 mg aspirin and infectious disease evaluation.   Hypokalemia I told her to restart the hydrochlorothiazide if needed. Since we held her treatment, her potassium level has  normalized.  Elevated WBC count The cause is unknown. She also had some fevers and sweats. I recommend infectious disease evaluation.  BRBPR (bright red blood per rectum) Apparently, she had colonoscopy 2 years ago which only showed colon polyps. I recommend general surgery evaluation but the patient declined.    All questions were answered. The patient knows to call the clinic with any problems, questions or concerns. No barriers to learning was detected.  I spent 15 minutes counseling the patient face to face. The total time spent in the appointment was 20 minutes and more than 50% was on counseling.     [place], [place], MD 06/16/2014 2:30 PM

## 2014-06-16 NOTE — Assessment & Plan Note (Signed)
The cause is unknown. She also had some fevers and sweats. I recommend infectious disease evaluation.

## 2014-06-16 NOTE — Assessment & Plan Note (Addendum)
Extensive workup did not show anything to suggest myeloproliferative disorder. Discussed with her the risk and benefit of bone marrow aspirate and biopsy. This certainly could be reactive in nature. I recommend 81 mg aspirin and infectious disease evaluation.

## 2014-06-16 NOTE — Telephone Encounter (Signed)
Pt confirmed labs/ov per 07/14 POF, gave pt AVS.....KJ °

## 2014-06-16 NOTE — Assessment & Plan Note (Signed)
Apparently, she had colonoscopy 2 years ago which only showed colon polyps. I recommend general surgery evaluation but the patient declined.

## 2014-06-16 NOTE — Telephone Encounter (Signed)
Informed pt of normal potassium level today and Dr. Calton Dach instructions below for resuming and reducing dose of HCTZ.  Pt verbalized understanding.

## 2014-06-16 NOTE — Assessment & Plan Note (Signed)
The cause is unknown. There could be a component of anemia chronic disease. Recommend observation only.

## 2014-06-16 NOTE — Telephone Encounter (Signed)
Message copied by Cathlean Cower on Tue Jun 16, 2014  2:06 PM ------      Message from: [facility] [place]: Tue Jun 16, 2014  1:36 PM      Regarding: K level       Is normal, please let her know, OK to resume HCTZ but either cut dose to half or take every other day as needed for swelling or stop altogether      ----- Message -----         From: [facility]         Sent: 06/16/2014  11:18 AM           To: Heath Lark, MD                   ------

## 2014-06-23 ENCOUNTER — Encounter: Payer: Self-pay | Admitting: Family Medicine

## 2014-07-16 ENCOUNTER — Telehealth: Payer: Self-pay

## 2014-07-16 NOTE — Telephone Encounter (Signed)
Pt has to give ok to send information.  If pt agrees, we can send copy of last OV and med list

## 2014-07-16 NOTE — Telephone Encounter (Signed)
Caller name :AnnMarie Relation to pt: [employer] Case Manager Call back number:[phone] Pharmacy:  Reason for call: Reggy Eye called to say that she is working with Celesta Gentile thru [facility] Case Management program, and she would like to get a copy her Plan of Treatment and recent medication list and if you have any questions to please call her and you can fax information to [phone]

## 2014-07-16 NOTE — Telephone Encounter (Signed)
Please advise on Plan of treatment.

## 2014-07-16 NOTE — Telephone Encounter (Signed)
Spoke with pt who gave ok to send information to Case manager with [employer]. States that she spoke with her yesterday. Last OV note and med list faxed.

## 2014-07-22 ENCOUNTER — Encounter: Payer: Self-pay | Admitting: Internal Medicine

## 2014-07-22 ENCOUNTER — Ambulatory Visit (INDEPENDENT_AMBULATORY_CARE_PROVIDER_SITE_OTHER): Payer: BC Managed Care – PPO | Admitting: Internal Medicine

## 2014-07-22 VITALS — BP 146/93 | HR 90 | Temp 98.4°F | Ht 64.0 in | Wt 223.2 lb

## 2014-07-22 DIAGNOSIS — R748 Abnormal levels of other serum enzymes: Secondary | ICD-10-CM

## 2014-07-22 DIAGNOSIS — R918 Other nonspecific abnormal finding of lung field: Secondary | ICD-10-CM

## 2014-07-22 DIAGNOSIS — E669 Obesity, unspecified: Secondary | ICD-10-CM

## 2014-07-22 DIAGNOSIS — R61 Generalized hyperhidrosis: Secondary | ICD-10-CM | POA: Insufficient documentation

## 2014-07-22 LAB — CBC WITH DIFFERENTIAL/PLATELET
Basophils Absolute: 0 10*3/uL (ref 0.0–0.1)
Basophils Relative: 0 % (ref 0–1)
EOS ABS: 0.1 10*3/uL (ref 0.0–0.7)
EOS PCT: 1 % (ref 0–5)
HEMATOCRIT: 34 % — AB (ref 36.0–46.0)
HEMOGLOBIN: 11 g/dL — AB (ref 12.0–15.0)
LYMPHS ABS: 2.2 10*3/uL (ref 0.7–4.0)
Lymphocytes Relative: 17 % (ref 12–46)
MCH: 27 pg (ref 26.0–34.0)
MCHC: 32.4 g/dL (ref 30.0–36.0)
MCV: 83.3 fL (ref 78.0–100.0)
MONO ABS: 0.6 10*3/uL (ref 0.1–1.0)
MONOS PCT: 5 % (ref 3–12)
Neutro Abs: 9.9 10*3/uL — ABNORMAL HIGH (ref 1.7–7.7)
Neutrophils Relative %: 77 % (ref 43–77)
Platelets: 517 10*3/uL — ABNORMAL HIGH (ref 150–400)
RBC: 4.08 MIL/uL (ref 3.87–5.11)
RDW: 12.6 % (ref 11.5–15.5)
WBC: 12.9 10*3/uL — ABNORMAL HIGH (ref 4.0–10.5)

## 2014-07-22 LAB — COMPLETE METABOLIC PANEL WITH GFR
ALT: 19 U/L (ref 0–35)
AST: 19 U/L (ref 0–37)
Albumin: 4.1 g/dL (ref 3.5–5.2)
Alkaline Phosphatase: 122 U/L — ABNORMAL HIGH (ref 39–117)
BILIRUBIN TOTAL: 0.5 mg/dL (ref 0.2–1.2)
BUN: 8 mg/dL (ref 6–23)
CO2: 26 meq/L (ref 19–32)
CREATININE: 0.89 mg/dL (ref 0.50–1.10)
Calcium: 8.7 mg/dL (ref 8.4–10.5)
Chloride: 104 mEq/L (ref 96–112)
GFR, Est African American: >89 mL/min
GFR, Est Non African American: 83 mL/min
Glucose, Bld: 91 mg/dL (ref 70–99)
Potassium: 3.8 mEq/L (ref 3.5–5.3)
Sodium: 139 mEq/L (ref 135–145)
Total Protein: 7.6 g/dL (ref 6.0–8.3)

## 2014-07-22 NOTE — Progress Notes (Signed)
Patient ID: Pamela Clarke, female   DOB: 1976/03/05, 38 y.o.   MRN: [id number]         [facility] for Infectious Disease  Reason for Consult: Leukocytosis Referring Physician: Dr. Heath Lark  Patient Active Problem List   Diagnosis Date Noted  . Elevated liver enzymes 07/22/2014    Priority: High  . Thrombocytosis 06/02/2014    Priority: High  . Normocytic anemia 06/02/2014    Priority: High  . Leukocytosis 05/13/2014    Priority: High  . Chest pain 03/13/2014    Priority: High  . Other malaise and fatigue 03/13/2014    Priority: High  . SOB (shortness of breath) 03/13/2014    Priority: High  . Gastroenteritis 12/12/2013    Priority: High  . Night sweats 07/22/2014    Priority: Medium  . Obesity 07/22/2014    Priority: Medium  . Pulmonary nodules 07/22/2014    Priority: Medium  . Hypokalemia 06/02/2014    Priority: Medium  . Anxiety and depression 03/31/2014    Priority: Medium  . HTN (hypertension) 12/02/2012    Priority: Medium  . BRBPR (bright red blood per rectum) 08/22/2012    Priority: Medium  . Birth control 05/07/2013  . Screening for malignant neoplasm of the cervix 08/22/2012  . Routine gynecological examination 08/22/2012    Patient's Medications  New Prescriptions   No medications on file  Previous Medications   AMLODIPINE (NORVASC) 10 MG TABLET    Take 1 tablet (10 mg total) by mouth daily.   FERROUS SULFATE 325 (65 FE) MG TABLET    Take 325 mg by mouth daily with breakfast.   FLUOXETINE (PROZAC) 20 MG TABLET    Take 1 tablet (20 mg total) by mouth daily.   HYDROCHLOROTHIAZIDE (MICROZIDE) 12.5 MG CAPSULE    Take 12.5 mg by mouth every other day.   [place] 28 0.25-35 MG-MCG TABLET    TAKE 1 TABLET BY MOUTH DAILY. SKIP PLACEBO PILLS AND GO RIGHT INTO NEXT PACK   VITAMIN B-12 (CYANOCOBALAMIN) 500 MCG TABLET    Take 500 mcg by mouth daily. 1 every 3 days  Modified Medications   No medications on file  Discontinued Medications   LOSARTAN  (COZAAR) 25 MG TABLET    Take 1 tablet (25 mg total) by mouth daily.   MULTIPLE VITAMIN (MULTIVITAMIN) TABLET    Take 1 tablet by mouth daily.   OMEPRAZOLE (PRILOSEC) 20 MG CAPSULE    Take 1 capsule (20 mg total) by mouth daily.    Recommendations: 1. Repeat CBC with differential and complete metabolic panel 2. Check HIV, hepatitis B, hepatitis C, Epstein-Barr virus and cytomegalovirus serologies 3. I will followup with her by phone once these results are available  Assessment: I do not have any good explanation for her recent constellation of symptoms that began after a bout of gastroenteritis. Her leukocytosis is quite nonspecific and appeared to be improving when last checked nearly 2 months ago. She's also been noted to have elevated transaminases dating back to at least 2013. I will check a repeat CBC complete metabolic panel. I will check for evidence of viral hepatitis and HIV.   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 That illness seemed to resolve spontaneously but in February she had the insidious onset of fatigue, chest pain radiating through to her back, intermittent shortness of breath and mild dry cough. She had no fever,  chills or sweats at that time. Her shortness of breath seemed to get better but the other symptoms persisted.   She underwent a chest CT scan in April which was normal except for a 3 mm and a 5 mm nodule in the left lung. She felt well enough to travel to [place] with a friend to celebrate her friend's birthday in June. On the return flight back she was asleep but woke suddenly with palpitations, shortness of breath, sweating, hot sensation running down her legs and swollen feet. She felt like she needed to get off of the plane but denied feeling like she had a panic attack. She went to the emergency department after her return home on June 10. She was given  sublingual nitroglycerin and a fentanyl patch with relief of her pain. She was afebrile at that time and had a normal pulse, blood pressure and room air oxygen saturation. Her EKG and cardiac enzymes were normal. She was noted to have an elevated white blood cell count of 16,300, chronic normocytic anemia and mild thrombocytosis. She was seen back by her primary care physician, Dr. Birdie Riddle, who referred her to Dr. Alvy Bimler for evaluation of the hematologic abnormalities. He did not find any obvious explanation.  She went on a [place] cruise in July of 2014 but has had no other unusual travel. She is bisexual and has had 4 lifetime sexual partners. She has a steady female partner currently. She has no history of any sexually transmitted diseases or hepatitis that she is aware of. She smokes cigarettes on occasion. She drinks alcohol rarely and only in moderation. She does not use any illicit drugs.  She's had no documented fevers at any of her recent clinic or ED visits. She has been monitoring her temperature twice daily since August 27 and has had no documented fevers at home.  Review of Systems: Constitutional: positive for malaise, night sweats and intermittent anorexia alternating with "stress overeating", negative for chills, fevers and weight loss Eyes: negative Ears, nose, mouth, throat, and face: negative Respiratory: positive for cough and SOB, negative for asthma, hemoptysis, sputum and wheezing Cardiovascular: positive for chest pain and palpitations, negative for exertional chest pressure/discomfort, irregular heart beat, orthopnea, paroxysmal nocturnal dyspnea and syncope Gastrointestinal: positive for nausea Genitourinary:negative Integument/breast: negative    Past Medical History  Diagnosis Date  . Arthritis   . Blood in stool   . Heart murmur   . Obesity   . Hypertension     history of this, no meds  . Fatty liver   . Hyperplastic colon polyp 10/22/12  . Anxiety   .  Thrombocytosis 06/02/2014  . Unspecified deficiency anemia 06/02/2014  . Hypokalemia 06/02/2014  . Depression     History  Substance Use Topics  . Smoking status: Light Tobacco Smoker    Types: Cigarettes  . Smokeless tobacco: Never Used  . Alcohol Use: Yes     Comment: occasionally wine 1-2 glasses q2 weeks    Family History  Problem Relation Age of Onset  . Stroke Father   . Hypertension Father   . Diabetes Father   . Hypertension Maternal Grandmother   . Stroke Maternal Grandmother   . Ovarian cancer Paternal Grandmother   . Colon cancer Neg Hx   . Heart disease Paternal Grandfather   . Heart disease Maternal Grandmother   . Stroke Paternal Grandfather    No Known Allergies  OBJECTIVE: Blood pressure 146/93, pulse 90, temperature 98.4 F (36.9 C), temperature source Oral, height  5\' 4"  (1.626 m), weight 223 lb 4 oz (101.266 kg), last menstrual period 07/19/2014. General: She is alert, well dressed and in no distress Skin: No rash Lymph nodes: No palpable adenopathy Eyes: Normal external exam Oral: No oropharyngeal lesions. Teeth in good condition Lungs: Clear  Cor: Regular S1 and S2 with no murmurs Abdomen: Obese, soft and nontender. No liver, spleen or other masses palpated Joints and extremities: 1+ pitting edema to mid shin bilaterally Mood and affect: Normal  Microbiology: No results found for this or any previous visit (from the past 240 hour(s)).  Michel Bickers, MD [facility] for [facility] [phone] pager   [phone] cell 07/22/2014, 5:17 PM

## 2014-07-23 LAB — EPSTEIN-BARR VIRUS EARLY D ANTIGEN ANTIBODY, IGG: EBV EA IGG: 28.4 U/mL — AB (ref <9.0)

## 2014-07-23 LAB — HEPATITIS B SURFACE ANTIGEN: Hepatitis B Surface Ag: NEGATIVE

## 2014-07-23 LAB — EPSTEIN-BARR VIRUS VCA ANTIBODY PANEL
EBV EA IGG: 28.4 U/mL — AB (ref <9.0)
EBV NA IgG: 318 U/mL — ABNORMAL HIGH (ref <18.0)
EBV VCA IgG: 441 U/mL — ABNORMAL HIGH (ref <18.0)
EBV VCA IgM: <10 U/mL (ref <36.0)

## 2014-07-23 LAB — HEPATITIS B SURFACE ANTIBODY,QUALITATIVE: HEP B S AB: NEGATIVE

## 2014-07-23 LAB — EPSTEIN-BARR VIRUS NUCLEAR ANTIGEN ANTIBODY, IGG: EBV NA IGG: 318 U/mL — AB (ref <18.0)

## 2014-07-23 LAB — HEPATITIS C ANTIBODY: HCV Ab: NEGATIVE

## 2014-07-23 LAB — CMV IGM: CMV IgM: <8 AU/mL (ref <30.00)

## 2014-07-23 LAB — HIV ANTIBODY (ROUTINE TESTING W REFLEX): HIV 1&2 Ab, 4th Generation: NONREACTIVE

## 2014-07-24 ENCOUNTER — Telehealth: Payer: Self-pay | Admitting: Internal Medicine

## 2014-07-24 NOTE — Telephone Encounter (Signed)
I called Ms. Pamela Clarke and left a message informing her that she still has very mild and nonspecific leukocytosis. She has evidence of past Epstein-Barr varus infection. It is possible that her initial illness in January and February of this year was acute mononucleosis that is causing lingering symptoms but the serologic results could also just as easily reflect very remote, and inactive infection. She has mild elevation in alkaline phosphatase but her transaminases have returned to normal. All of her other serologic tests are negative. I see no evidence of any active, treatable infection at this time.  Lab Results  Component Value Date   WBC 12.9* 07/22/2014   HGB 11.0* 07/22/2014   HCT 34.0* 07/22/2014   MCV 83.3 07/22/2014   PLT 517* 07/22/2014   Lab Results  Component Value Date   CREATININE 0.89 07/22/2014   BUN 8 07/22/2014   NA 139 07/22/2014   K 3.8 07/22/2014   CL 104 07/22/2014   CO2 26 07/22/2014   Lab Results  Component Value Date   ALT 19 07/22/2014   AST 19 07/22/2014   ALKPHOS 122* 07/22/2014   BILITOT 0.5 07/22/2014   Cytomegalovirus IgM 07/22/2014: Negative  Epstein-Barr virus 07/22/2014:  Viral capsid antigen IgM: Negative  Viral capsid antigen IgG: Positive   Nuclear antigen IgG: Positive  Early antigen IgG: Positive  Hepatitis B surface antigen and surface antibody 07/22/2014: Both negative  Hepatitis C antibody 07/22/2014: Negative  HIV antibody 07/22/2014: Negative  Michel Bickers, MD [facility] [phone] pager   [phone] cell 07/24/2014, 12:45 PM

## 2014-07-27 ENCOUNTER — Ambulatory Visit: Payer: BC Managed Care – PPO | Admitting: Family Medicine

## 2014-07-29 ENCOUNTER — Ambulatory Visit (INDEPENDENT_AMBULATORY_CARE_PROVIDER_SITE_OTHER): Payer: BC Managed Care – PPO | Admitting: Family Medicine

## 2014-07-29 ENCOUNTER — Encounter: Payer: Self-pay | Admitting: Family Medicine

## 2014-07-29 VITALS — BP 130/84 | HR 91 | Temp 98.2°F | Resp 16 | Wt 217.0 lb

## 2014-07-29 DIAGNOSIS — D72829 Elevated white blood cell count, unspecified: Secondary | ICD-10-CM

## 2014-07-29 NOTE — Patient Instructions (Signed)
Schedule a lab visit in 1 month to recheck blood count If your symptoms change or worsen, please call! I'm so glad the good days are coming back!!! If things do not significantly improve or the labs don't normalize- we'll seek a 2nd opinion at [facility] Call with any questions or concerns Happy Labor Day!!

## 2014-07-29 NOTE — Progress Notes (Signed)
   Subjective:    Patient ID: Pamela Clarke, female    DOB: 1976-01-20, 38 y.o.   MRN: [id number]  HPI Pt is here to f/u for ongoing sxs- has seen Heme, ID and work up has been unrevealing.  Labs show that she has previous mono infxn/exposure.  Still having intermittent fatigue, fevers (Tm 99.4), cough.  No HAs.  Pt reports she is starting to feel better.  Has seen cards, pulm w/o obvious cause of symptoms.   Review of Systems For ROS see HPI     Objective:   Physical Exam  Vitals reviewed. Constitutional: She is oriented to person, place, and time. She appears well-developed and well-nourished. No distress.  obese  HENT:  Head: Normocephalic and atraumatic.  Eyes: Conjunctivae and EOM are normal. Pupils are equal, round, and reactive to light.  Neck: Normal range of motion. Neck supple. No thyromegaly present.  Cardiovascular: Normal rate, regular rhythm, normal heart sounds and intact distal pulses.   No murmur heard. Pulmonary/Chest: Effort normal and breath sounds normal. No respiratory distress.  Abdominal: Soft. She exhibits no distension. There is no tenderness.  Musculoskeletal: She exhibits no edema.  Lymphadenopathy:    She has no cervical adenopathy.  Neurological: She is alert and oriented to person, place, and time.  Skin: Skin is warm and dry.  Psychiatric: She has a normal mood and affect. Her behavior is normal.          Assessment & Plan:

## 2014-07-29 NOTE — Progress Notes (Signed)
Pre visit review using our clinic review tool, if applicable. No additional management support is needed unless otherwise documented below in the visit note. 

## 2014-08-02 NOTE — Assessment & Plan Note (Signed)
Unclear cause at this time.  May be related to pt's mono.  Has had normal heme, pulm, cards, and now ID w/u w/ exception of mono.  Will repeat CBC in 1 month to chart WBC and if still elevated will get 2nd opinion at [place] is starting to feel better.  Will continue to follow.

## 2014-08-11 ENCOUNTER — Encounter: Payer: Self-pay | Admitting: Family Medicine

## 2014-08-14 ENCOUNTER — Telehealth: Payer: Self-pay | Admitting: Family Medicine

## 2014-08-14 NOTE — Telephone Encounter (Signed)
Caller name: [place], Harder Relation to pt: self  Call back number: [phone]   Reason for call:   pt wanted to inform per previous conversation on my chart lump on neck has not gone away pt would like to see you 08/20/14. You have no availability only same day can i put her in the 9:45am 08/20/14 same day slot. Please advise

## 2014-08-14 NOTE — Telephone Encounter (Signed)
Does she want to wait that long?  That's not until Thursday

## 2014-08-14 NOTE — Telephone Encounter (Signed)
Pt can come in today at 4:15pm or Monday at 9:45am you have a provider block but please be aware you have 3 physicals that day 08/17/14 or she can come in Tuesday 9/15 at 11am. Please advise

## 2014-08-14 NOTE — Telephone Encounter (Signed)
Ok to schedule in same day slot

## 2014-08-18 ENCOUNTER — Ambulatory Visit (INDEPENDENT_AMBULATORY_CARE_PROVIDER_SITE_OTHER): Payer: BC Managed Care – PPO | Admitting: Family Medicine

## 2014-08-18 ENCOUNTER — Encounter: Payer: Self-pay | Admitting: Family Medicine

## 2014-08-18 VITALS — BP 140/80 | HR 104 | Temp 98.3°F | Resp 16 | Wt 216.1 lb

## 2014-08-18 DIAGNOSIS — M799 Soft tissue disorder, unspecified: Secondary | ICD-10-CM

## 2014-08-18 DIAGNOSIS — M7989 Other specified soft tissue disorders: Secondary | ICD-10-CM

## 2014-08-18 NOTE — Progress Notes (Signed)
Pre visit review using our clinic review tool, if applicable. No additional management support is needed unless otherwise documented below in the visit note. 

## 2014-08-18 NOTE — Patient Instructions (Signed)
Follow up as needed We'll notify you of your blood count We'll review the [place] results as soon as they are available and let you know Call with any questions or concerns Hang in there!!

## 2014-08-18 NOTE — Assessment & Plan Note (Signed)
New.  Pt's lack of pain, erythema, or change in size is most consistent w/ lipoma.  Will get [place] to assess.  Given pt's other cluster of ongoing symptoms- low grade fevers, cough, night sweats- will get CBC.  Discussed w/ pt and wife that if her CBC remains elevated, will pursue referral at [facility] for additional evaluation since pt has already seen local heme and ID w/o definitive dx.  Pt expressed understanding and is in agreement w/ plan.

## 2014-08-18 NOTE — Progress Notes (Signed)
   Subjective:    Patient ID: Pamela Clarke, female    DOB: [birth year], 38 y.o.   MRN: [id number]  HPI Lump under arm- L arm, noted ~3 weeks ago.  Not painful.  No redness.  Not draining.  Has been shaving under arms.  No similar lumps elsewhere.  Intermittent low grade fevers, ongoing CP, cough, night sweats.  No changes in size or shape since appearing.   Review of Systems For ROS see HPI     Objective:   Physical Exam  Vitals reviewed. Constitutional: She is oriented to person, place, and time. She appears well-developed and well-nourished. No distress.  HENT:  Head: Normocephalic and atraumatic.  Musculoskeletal:  3-4 cm soft tissue mass on L axilla- no TTP, of fluctuance, no overlying redness- most consistent w/ lipoma  Lymphadenopathy:       Head (right side): No submental, no submandibular, no tonsillar and no occipital adenopathy present.       Head (left side): No submental, no submandibular, no tonsillar and no occipital adenopathy present.    She has no cervical adenopathy.       Right axillary: No pectoral and no lateral adenopathy present.       Left axillary: No pectoral and no lateral adenopathy present.      Right: No supraclavicular adenopathy present.       Left: No supraclavicular adenopathy present.  Neurological: She is alert and oriented to person, place, and time.  Skin: Skin is warm and dry. No erythema.  Psychiatric: She has a normal mood and affect. Her behavior is normal.          Assessment & Plan:

## 2014-08-19 ENCOUNTER — Ambulatory Visit (HOSPITAL_BASED_OUTPATIENT_CLINIC_OR_DEPARTMENT_OTHER): Payer: BC Managed Care – PPO

## 2014-08-19 LAB — CBC WITH DIFFERENTIAL/PLATELET
BASOS PCT: 0.4 % (ref 0.0–3.0)
Basophils Absolute: 0 10*3/uL (ref 0.0–0.1)
EOS ABS: 0.1 10*3/uL (ref 0.0–0.7)
Eosinophils Relative: 0.5 % (ref 0.0–5.0)
HCT: 34.3 % — ABNORMAL LOW (ref 36.0–46.0)
Hemoglobin: 11.1 g/dL — ABNORMAL LOW (ref 12.0–15.0)
Lymphocytes Relative: 15.7 % (ref 12.0–46.0)
Lymphs Abs: 1.6 10*3/uL (ref 0.7–4.0)
MCHC: 32.3 g/dL (ref 30.0–36.0)
MCV: 85.7 fl (ref 78.0–100.0)
Monocytes Absolute: 0.4 10*3/uL (ref 0.1–1.0)
Monocytes Relative: 4 % (ref 3.0–12.0)
NEUTROS PCT: 79.4 % — AB (ref 43.0–77.0)
Neutro Abs: 8.1 10*3/uL — ABNORMAL HIGH (ref 1.4–7.7)
PLATELETS: 487 10*3/uL — AB (ref 150.0–400.0)
RBC: 4.01 Mil/uL (ref 3.87–5.11)
RDW: 12.8 % (ref 11.5–15.5)
WBC: 10.2 10*3/uL (ref 4.0–10.5)

## 2014-08-21 ENCOUNTER — Other Ambulatory Visit: Payer: Self-pay | Admitting: Family Medicine

## 2014-08-21 ENCOUNTER — Other Ambulatory Visit: Payer: Self-pay

## 2014-08-21 DIAGNOSIS — R2232 Localized swelling, mass and lump, left upper limb: Secondary | ICD-10-CM

## 2014-08-24 ENCOUNTER — Other Ambulatory Visit: Payer: Self-pay | Admitting: Family Medicine

## 2014-08-24 ENCOUNTER — Other Ambulatory Visit: Payer: BC Managed Care – PPO

## 2014-08-24 ENCOUNTER — Ambulatory Visit
Admission: RE | Admit: 2014-08-24 | Discharge: 2014-08-24 | Disposition: A | Discharge status: Home / Self Care | Payer: BC Managed Care – PPO | Source: Ambulatory Visit | Attending: Family Medicine | Admitting: Family Medicine

## 2014-08-24 ENCOUNTER — Other Ambulatory Visit (HOSPITAL_COMMUNITY): Payer: BC Managed Care – PPO

## 2014-08-24 DIAGNOSIS — R2232 Localized swelling, mass and lump, left upper limb: Secondary | ICD-10-CM

## 2014-08-24 DIAGNOSIS — N6310 Unspecified lump in the right breast, unspecified quadrant: Secondary | ICD-10-CM

## 2014-08-25 ENCOUNTER — Other Ambulatory Visit: Payer: Self-pay | Admitting: Family Medicine

## 2014-08-25 DIAGNOSIS — R2232 Localized swelling, mass and lump, left upper limb: Secondary | ICD-10-CM

## 2014-08-26 ENCOUNTER — Ambulatory Visit
Admission: RE | Admit: 2014-08-26 | Discharge: 2014-08-26 | Disposition: A | Discharge status: Home / Self Care | Payer: BC Managed Care – PPO | Source: Ambulatory Visit | Attending: Family Medicine | Admitting: Family Medicine

## 2014-08-26 DIAGNOSIS — R2232 Localized swelling, mass and lump, left upper limb: Secondary | ICD-10-CM

## 2014-08-31 ENCOUNTER — Other Ambulatory Visit: Payer: Self-pay | Admitting: Family Medicine

## 2014-08-31 ENCOUNTER — Other Ambulatory Visit: Payer: BC Managed Care – PPO

## 2014-09-01 MED ORDER — FLUOXETINE HCL 20 MG PO TABS: 20.0000 mg | ORAL_TABLET | Freq: Every day | ORAL | Status: DC

## 2014-09-01 NOTE — Telephone Encounter (Signed)
Med filled.  

## 2014-09-16 ENCOUNTER — Encounter: Payer: Self-pay | Admitting: Hematology and Oncology

## 2014-09-16 ENCOUNTER — Ambulatory Visit (HOSPITAL_BASED_OUTPATIENT_CLINIC_OR_DEPARTMENT_OTHER): Payer: BC Managed Care – PPO | Admitting: Hematology and Oncology

## 2014-09-16 ENCOUNTER — Telehealth: Payer: Self-pay | Admitting: Hematology and Oncology

## 2014-09-16 ENCOUNTER — Other Ambulatory Visit (HOSPITAL_BASED_OUTPATIENT_CLINIC_OR_DEPARTMENT_OTHER): Payer: BC Managed Care – PPO

## 2014-09-16 VITALS — BP 152/90 | HR 83 | Temp 98.6°F | Resp 18 | Ht 64.0 in | Wt 208.3 lb

## 2014-09-16 DIAGNOSIS — R0789 Other chest pain: Secondary | ICD-10-CM

## 2014-09-16 DIAGNOSIS — D72829 Elevated white blood cell count, unspecified: Secondary | ICD-10-CM

## 2014-09-16 DIAGNOSIS — D649 Anemia, unspecified: Secondary | ICD-10-CM

## 2014-09-16 DIAGNOSIS — D473 Essential (hemorrhagic) thrombocythemia: Secondary | ICD-10-CM

## 2014-09-16 DIAGNOSIS — D75839 Thrombocytosis, unspecified: Secondary | ICD-10-CM

## 2014-09-16 DIAGNOSIS — F32A Depression, unspecified: Secondary | ICD-10-CM

## 2014-09-16 DIAGNOSIS — D509 Iron deficiency anemia, unspecified: Secondary | ICD-10-CM

## 2014-09-16 DIAGNOSIS — R911 Solitary pulmonary nodule: Secondary | ICD-10-CM

## 2014-09-16 DIAGNOSIS — R509 Fever, unspecified: Secondary | ICD-10-CM

## 2014-09-16 DIAGNOSIS — R079 Chest pain, unspecified: Secondary | ICD-10-CM

## 2014-09-16 DIAGNOSIS — F329 Major depressive disorder, single episode, unspecified: Secondary | ICD-10-CM

## 2014-09-16 DIAGNOSIS — F418 Other specified anxiety disorders: Secondary | ICD-10-CM

## 2014-09-16 DIAGNOSIS — F419 Anxiety disorder, unspecified: Secondary | ICD-10-CM

## 2014-09-16 HISTORY — DX: Solitary pulmonary nodule: R91.1

## 2014-09-16 LAB — CBC & DIFF AND RETIC
BASO%: 0.2 % (ref 0.0–2.0)
BASOS ABS: 0 10*3/uL (ref 0.0–0.1)
EOS ABS: 0.1 10*3/uL (ref 0.0–0.5)
EOS%: 0.6 % (ref 0.0–7.0)
HCT: 35.4 % (ref 34.8–46.6)
HEMOGLOBIN: 11.4 g/dL — AB (ref 11.6–15.9)
Immature Retic Fract: 3.5 % (ref 1.60–10.00)
LYMPH%: 19.1 % (ref 14.0–49.7)
MCH: 27.3 pg (ref 25.1–34.0)
MCHC: 32.2 g/dL (ref 31.5–36.0)
MCV: 84.7 fL (ref 79.5–101.0)
MONO#: 0.5 10*3/uL (ref 0.1–0.9)
MONO%: 6.4 % (ref 0.0–14.0)
NEUT%: 73.7 % (ref 38.4–76.8)
NEUTROS ABS: 6.1 10*3/uL (ref 1.5–6.5)
Platelets: 482 10*3/uL — ABNORMAL HIGH (ref 145–400)
RBC: 4.18 10*6/uL (ref 3.70–5.45)
RDW: 12.4 % (ref 11.2–14.5)
Retic %: 1.77 % (ref 0.70–2.10)
Retic Ct Abs: 73.99 10*3/uL (ref 33.70–90.70)
WBC: 8.3 10*3/uL (ref 3.9–10.3)
lymph#: 1.6 10*3/uL (ref 0.9–3.3)

## 2014-09-16 LAB — MORPHOLOGY: PLT EST: INCREASED

## 2014-09-16 LAB — SEDIMENTATION RATE: SED RATE: 20 mm/h (ref 0–22)

## 2014-09-16 LAB — CHCC SMEAR

## 2014-09-16 NOTE — Assessment & Plan Note (Signed)
Cough is unknown. This has resolved since she quit smoking.

## 2014-09-16 NOTE — Assessment & Plan Note (Signed)
I tried to reassure her that so far her blood tests are improving and we have no definitive medical problems. I suspect she has significant deconditioning and I would recommend gentle exercise as tolerated.

## 2014-09-16 NOTE — Assessment & Plan Note (Signed)
She had abnormal lung nodules 6 months ago. She has lost weight and is a smoker. She has nonspecific chest pain. Due to her smoking history, I recommend we check another CT scan of the chest to ensure this has not progressed and she agreed.

## 2014-09-16 NOTE — Assessment & Plan Note (Signed)
All her workup so far is negative for myeloproliferative disorder. I do not recommend bone marrow aspirate and biopsy. I recommend aspirin therapy to reduce risk of blood clot

## 2014-09-16 NOTE — Assessment & Plan Note (Signed)
This is stable, likely anemia of chronic disease. I will recheck iron studies in the next visit.

## 2014-09-16 NOTE — Progress Notes (Signed)
[facility] OFFICE PROGRESS NOTE  [name], MD SUMMARY OF HEMATOLOGIC HISTORY:  She was found to have abnormal CBC from routine blood work. She has been feeling unwell with nonspecific shortness of breath, chest pain, fatigue, anorexia and 15 pound weight loss over the past 1-1/2 months. She also has some frequent night sweats. She has dry, nonproductive cough over the last 2 months of unknown etiology. She had passage of blood in his stool on a regular basis every week over the past few years. She had colonoscopy done several years ago which show only colon polyps. She also complained of back spasm. CT angiogram dated 03/13/2014 show no evidence of pneumonia or PE. 2 nonspecific lung nodules were found. Echocardiogram dated 04/02/2014 show normal ejection fraction. Pulmonary function testing done on 04/23/2014 were unremarkable. Blood work for BCR/ABL, JAK2 mutation and MPL were negative. INTERVAL HISTORY: Pamela Clarke 38 y.o. female returns for further followup. She has significant fatigue and has lost some weight. She's not motivated to exercise and complained of intermittent chest pain. The patient has crying spells in my office.  I have reviewed the past medical history, past surgical history, social history and family history with the patient and they are unchanged from previous note.  ALLERGIES:  has No Known Allergies.  MEDICATIONS:  Current Outpatient Prescriptions  Medication Sig Dispense Refill  . amLODipine (NORVASC) 10 MG tablet Take 1 tablet (10 mg total) by mouth daily.  90 tablet  1  . ferrous sulfate 325 (65 FE) MG tablet Take 325 mg by mouth daily with breakfast.      . FLUoxetine (PROZAC) 20 MG tablet Take 1 tablet (20 mg total) by mouth daily.  90 tablet  1  . hydrochlorothiazide (MICROZIDE) 12.5 MG capsule Take 12.5 mg by mouth every other day.      . SPRINTEC 28 0.25-35 MG-MCG tablet TAKE 1 TABLET BY MOUTH DAILY. SKIP PLACEBO PILLS AND GO  RIGHT INTO NEXT PACK  112 tablet  3  . vitamin B-12 (CYANOCOBALAMIN) 500 MCG tablet Take 500 mcg by mouth daily. 1 every 3 days       No current facility-administered medications for this visit.     REVIEW OF SYSTEMS:   Constitutional: Denies fevers, chills or night sweats Eyes: Denies blurriness of vision Ears, nose, mouth, throat, and face: Denies mucositis or sore throat Respiratory: Denies cough, dyspnea or wheezes Gastrointestinal:  Denies nausea, heartburn or change in bowel habits Skin: Denies abnormal skin rashes Lymphatics: Denies new lymphadenopathy or easy bruising Neurological:Denies numbness, tingling or new weaknesses Behavioral/Psych: Mood is stable, no new changes  All other systems were reviewed with the patient and are negative.  PHYSICAL EXAMINATION: ECOG PERFORMANCE STATUS: 1 - Symptomatic but completely ambulatory  Filed Vitals:   09/16/14 1000  BP: 152/90  Pulse: 83  Temp: 98.6 F (37 C)  Resp: 18   Filed Weights   09/16/14 1000  Weight: 208 lb 4.8 oz (94.484 kg)    GENERAL:alert, no distress and comfortable SKIN: skin color, texture, turgor are normal, no rashes or significant lesions EYES: normal, Conjunctiva are pink and non-injected, sclera clear Musculoskeletal:no cyanosis of digits and no clubbing  NEURO: alert & oriented x 3 with fluent speech, no focal motor/sensory deficits  LABORATORY DATA:  I have reviewed the data as listed Results for orders placed in visit on 09/16/14 (from the past 48 hour(s))  CBC & DIFF AND RETIC     Status: Abnormal   Collection Time  09/16/14  9:37 AM      Result Value Ref Range   WBC 8.3  3.9 - 10.3 10e3/uL   NEUT# 6.1  1.5 - 6.5 10e3/uL   HGB 11.4 (*) 11.6 - 15.9 g/dL   HCT 35.4  34.8 - 46.6 %   Platelets 482 (*) 145 - 400 10e3/uL   MCV 84.7  79.5 - 101.0 fL   MCH 27.3  25.1 - 34.0 pg   MCHC 32.2  31.5 - 36.0 g/dL   RBC 4.18  3.70 - 5.45 10e6/uL   RDW 12.4  11.2 - 14.5 %   lymph# 1.6  0.9 - 3.3  10e3/uL   MONO# 0.5  0.1 - 0.9 10e3/uL   Eosinophils Absolute 0.1  0.0 - 0.5 10e3/uL   Basophils Absolute 0.0  0.0 - 0.1 10e3/uL   NEUT% 73.7  38.4 - 76.8 %   LYMPH% 19.1  14.0 - 49.7 %   MONO% 6.4  0.0 - 14.0 %   EOS% 0.6  0.0 - 7.0 %   BASO% 0.2  0.0 - 2.0 %   Retic % 1.77  0.70 - 2.10 %   Retic Ct Abs 73.99  33.70 - 90.70 10e3/uL   Immature Retic Fract 3.50  1.60 - 10.00 %  [facility] SMEAR     Status: None   Collection Time    09/16/14  9:37 AM      Result Value Ref Range   Smear Result Smear Available    MORPHOLOGY     Status: None   Collection Time    09/16/14  9:37 AM      Result Value Ref Range   Polychromasia Slight  Slight   White Cell Comments C/W auto diff     PLT EST Increased  Adequate  SEDIMENTATION RATE     Status: None   Collection Time    09/16/14  9:40 AM      Result Value Ref Range   Sed Rate 20  0 - 22 mm/hr    Lab Results  Component Value Date   WBC 8.3 09/16/2014   HGB 11.4* 09/16/2014   HCT 35.4 09/16/2014   MCV 84.7 09/16/2014   PLT 482* 09/16/2014    ASSESSMENT & PLAN:  Leukocytosis Cough is unknown. This has resolved since she quit smoking.  Normocytic anemia This is stable, likely anemia of chronic disease. I will recheck iron studies in the next visit.  Lung nodule She had abnormal lung nodules 6 months ago. She has lost weight and is a smoker. She has nonspecific chest pain. Due to her smoking history, I recommend we check another CT scan of the chest to ensure this has not progressed and she agreed.  Anxiety and depression I tried to reassure her that so far her blood tests are improving and we have no definitive medical problems. I suspect she has significant deconditioning and I would recommend gentle exercise as tolerated.  Thrombocytosis All her workup so far is negative for myeloproliferative disorder. I do not recommend bone marrow aspirate and biopsy. I recommend aspirin therapy to reduce risk of blood clot    All questions  were answered. The patient knows to call the clinic with any problems, questions or concerns. No barriers to learning was detected.  I spent 25 minutes counseling the patient face to face. The total time spent in the appointment was 30 minutes and more than 50% was on counseling.     [place], [place], MD 09/16/2014 10:05 PM

## 2014-09-16 NOTE — Telephone Encounter (Signed)
Pt confirmed labs/ov per 10/14 POF, gave pt AVS.... KJ °

## 2014-09-18 ENCOUNTER — Telehealth: Payer: Self-pay | Admitting: Nurse Practitioner

## 2014-09-18 ENCOUNTER — Ambulatory Visit (HOSPITAL_COMMUNITY)
Admission: RE | Admit: 2014-09-18 | Discharge: 2014-09-18 | Disposition: A | Discharge status: Home / Self Care | Payer: BC Managed Care – PPO | Source: Ambulatory Visit | Attending: Hematology and Oncology | Admitting: Hematology and Oncology

## 2014-09-18 DIAGNOSIS — R0789 Other chest pain: Secondary | ICD-10-CM

## 2014-09-18 DIAGNOSIS — M7989 Other specified soft tissue disorders: Secondary | ICD-10-CM | POA: Diagnosis not present

## 2014-09-18 DIAGNOSIS — R634 Abnormal weight loss: Secondary | ICD-10-CM | POA: Insufficient documentation

## 2014-09-18 DIAGNOSIS — R911 Solitary pulmonary nodule: Secondary | ICD-10-CM | POA: Diagnosis not present

## 2014-09-18 DIAGNOSIS — R590 Localized enlarged lymph nodes: Secondary | ICD-10-CM | POA: Insufficient documentation

## 2014-09-18 MED ORDER — IOHEXOL 300 MG/ML  SOLN
80.0000 mL | Freq: Once | INTRAMUSCULAR | Status: AC | PRN
  Administered 2014-09-18: 80 mL via INTRAVENOUS

## 2014-09-18 NOTE — Telephone Encounter (Signed)
Informed patient of the results blow per Dr. Alvy Bimler

## 2014-09-18 NOTE — Telephone Encounter (Signed)
Message copied by Alla Feeling on Fri Sep 18, 2014  3:56 PM ------      Message from: Cathlean Cower      Created: Fri Sep 18, 2014  3:50 PM      Regarding: FW: CT chest                   ----- Message -----         From: Heath Lark, MD         Sent: 09/18/2014   8:59 AM           To: Cathlean Cower, RN      Subject: CT chest                                                 PLease tell it is completely normal. No further scans are needed in the future. No abnormal changes in the lymph nodes or her chest.      ----- Message -----         From: Rad Results In Interface         Sent: 09/18/2014   8:28 AM           To: Heath Lark, MD                   ------

## 2014-10-05 ENCOUNTER — Encounter: Payer: Self-pay | Admitting: Family Medicine

## 2014-10-05 DIAGNOSIS — R509 Fever, unspecified: Secondary | ICD-10-CM

## 2014-10-05 DIAGNOSIS — R61 Generalized hyperhidrosis: Secondary | ICD-10-CM

## 2014-10-05 DIAGNOSIS — R5383 Other fatigue: Secondary | ICD-10-CM

## 2014-10-05 NOTE — Telephone Encounter (Signed)
Referral placed.

## 2014-10-23 ENCOUNTER — Other Ambulatory Visit: Payer: Self-pay | Admitting: Family Medicine

## 2014-10-23 DIAGNOSIS — N63 Unspecified lump in unspecified breast: Secondary | ICD-10-CM

## 2014-11-03 ENCOUNTER — Ambulatory Visit
Admission: RE | Admit: 2014-11-03 | Discharge: 2014-11-03 | Disposition: A | Discharge status: Home / Self Care | Payer: BC Managed Care – PPO | Source: Ambulatory Visit | Attending: Family Medicine | Admitting: Family Medicine

## 2014-11-03 DIAGNOSIS — N63 Unspecified lump in unspecified breast: Secondary | ICD-10-CM

## 2014-11-05 ENCOUNTER — Encounter: Payer: Self-pay | Admitting: Family Medicine

## 2014-11-05 ENCOUNTER — Other Ambulatory Visit (INDEPENDENT_AMBULATORY_CARE_PROVIDER_SITE_OTHER): Payer: BC Managed Care – PPO

## 2014-11-05 ENCOUNTER — Other Ambulatory Visit: Payer: Self-pay | Admitting: Family Medicine

## 2014-11-05 DIAGNOSIS — R61 Generalized hyperhidrosis: Secondary | ICD-10-CM

## 2014-11-05 LAB — LUTEINIZING HORMONE: LH: 0.52 m[IU]/mL

## 2014-11-05 LAB — FOLLICLE STIMULATING HORMONE: FSH: 1 m[IU]/mL

## 2014-11-07 ENCOUNTER — Encounter: Payer: Self-pay | Admitting: Family Medicine

## 2014-11-24 ENCOUNTER — Other Ambulatory Visit: Payer: Self-pay

## 2014-11-24 DIAGNOSIS — R0602 Shortness of breath: Secondary | ICD-10-CM

## 2014-11-24 MED ORDER — AMLODIPINE BESYLATE 10 MG PO TABS: 10.0000 mg | ORAL_TABLET | Freq: Every day | ORAL | Status: DC

## 2015-01-28 ENCOUNTER — Other Ambulatory Visit: Payer: Self-pay

## 2015-01-28 ENCOUNTER — Ambulatory Visit (INDEPENDENT_AMBULATORY_CARE_PROVIDER_SITE_OTHER): Payer: BLUE CROSS/BLUE SHIELD | Admitting: Medical

## 2015-01-28 ENCOUNTER — Ambulatory Visit: Payer: Self-pay | Admitting: Family Medicine

## 2015-01-28 ENCOUNTER — Encounter: Payer: Self-pay | Admitting: Medical

## 2015-01-28 ENCOUNTER — Encounter (HOSPITAL_BASED_OUTPATIENT_CLINIC_OR_DEPARTMENT_OTHER): Payer: Self-pay | Admitting: *Deleted

## 2015-01-28 ENCOUNTER — Emergency Department (HOSPITAL_BASED_OUTPATIENT_CLINIC_OR_DEPARTMENT_OTHER)
Admission: EM | Admit: 2015-01-28 | Discharge: 2015-01-28 | Disposition: A | Discharge status: Home / Self Care | Payer: BLUE CROSS/BLUE SHIELD | Attending: Emergency Medicine | Admitting: Emergency Medicine

## 2015-01-28 ENCOUNTER — Emergency Department (HOSPITAL_BASED_OUTPATIENT_CLINIC_OR_DEPARTMENT_OTHER): Payer: BLUE CROSS/BLUE SHIELD

## 2015-01-28 VITALS — BP 140/80 | HR 96 | Temp 98.8°F | Ht 64.0 in | Wt 213.8 lb

## 2015-01-28 DIAGNOSIS — I10 Essential (primary) hypertension: Secondary | ICD-10-CM | POA: Insufficient documentation

## 2015-01-28 DIAGNOSIS — D649 Anemia, unspecified: Secondary | ICD-10-CM | POA: Insufficient documentation

## 2015-01-28 DIAGNOSIS — R51 Headache: Secondary | ICD-10-CM

## 2015-01-28 DIAGNOSIS — Z79899 Other long term (current) drug therapy: Secondary | ICD-10-CM | POA: Diagnosis not present

## 2015-01-28 DIAGNOSIS — F419 Anxiety disorder, unspecified: Secondary | ICD-10-CM | POA: Diagnosis not present

## 2015-01-28 DIAGNOSIS — R0602 Shortness of breath: Secondary | ICD-10-CM | POA: Diagnosis not present

## 2015-01-28 DIAGNOSIS — Z86010 Personal history of colonic polyps: Secondary | ICD-10-CM | POA: Diagnosis not present

## 2015-01-28 DIAGNOSIS — R519 Headache, unspecified: Secondary | ICD-10-CM

## 2015-01-28 DIAGNOSIS — Z87891 Personal history of nicotine dependence: Secondary | ICD-10-CM | POA: Insufficient documentation

## 2015-01-28 DIAGNOSIS — Z8719 Personal history of other diseases of the digestive system: Secondary | ICD-10-CM | POA: Insufficient documentation

## 2015-01-28 DIAGNOSIS — R011 Cardiac murmur, unspecified: Secondary | ICD-10-CM | POA: Insufficient documentation

## 2015-01-28 DIAGNOSIS — Z8739 Personal history of other diseases of the musculoskeletal system and connective tissue: Secondary | ICD-10-CM | POA: Diagnosis not present

## 2015-01-28 DIAGNOSIS — F329 Major depressive disorder, single episode, unspecified: Secondary | ICD-10-CM | POA: Insufficient documentation

## 2015-01-28 DIAGNOSIS — R0789 Other chest pain: Secondary | ICD-10-CM | POA: Diagnosis not present

## 2015-01-28 DIAGNOSIS — M7989 Other specified soft tissue disorders: Secondary | ICD-10-CM | POA: Diagnosis not present

## 2015-01-28 DIAGNOSIS — E669 Obesity, unspecified: Secondary | ICD-10-CM | POA: Diagnosis not present

## 2015-01-28 LAB — BASIC METABOLIC PANEL
ANION GAP: 5 (ref 5–15)
BUN: 13 mg/dL (ref 6–23)
CO2: 27 mmol/L (ref 19–32)
CREATININE: 1.11 mg/dL — AB (ref 0.50–1.10)
Calcium: 9.1 mg/dL (ref 8.4–10.5)
Chloride: 99 mmol/L (ref 96–112)
GFR calc non Af Amer: 62 mL/min — ABNORMAL LOW (ref >90)
GFR, EST AFRICAN AMERICAN: 72 mL/min — AB (ref >90)
Glucose, Bld: 97 mg/dL (ref 70–99)
POTASSIUM: 3.5 mmol/L (ref 3.5–5.1)
Sodium: 131 mmol/L — ABNORMAL LOW (ref 135–145)

## 2015-01-28 LAB — CBC
HCT: 38.4 % (ref 36.0–46.0)
HEMOGLOBIN: 12.5 g/dL (ref 12.0–15.0)
MCH: 27.9 pg (ref 26.0–34.0)
MCHC: 32.6 g/dL (ref 30.0–36.0)
MCV: 85.7 fL (ref 78.0–100.0)
PLATELETS: 535 10*3/uL — AB (ref 150–400)
RBC: 4.48 MIL/uL (ref 3.87–5.11)
RDW: 12 % (ref 11.5–15.5)
WBC: 13.3 10*3/uL — AB (ref 4.0–10.5)

## 2015-01-28 LAB — D-DIMER, QUANTITATIVE: D-Dimer, Quant: <0.27 ug/mL-FEU (ref 0.00–0.48)

## 2015-01-28 LAB — TROPONIN I

## 2015-01-28 MED ORDER — ASPIRIN 81 MG PO CHEW
324.0000 mg | CHEWABLE_TABLET | Freq: Once | ORAL | Status: AC
  Administered 2015-01-28: 324 mg via ORAL
  Filled 2015-01-28: qty 4

## 2015-01-28 MED ORDER — ACETAMINOPHEN 500 MG PO TABS
1000.0000 mg | ORAL_TABLET | Freq: Once | ORAL | Status: AC
  Administered 2015-01-28: 1000 mg via ORAL
  Filled 2015-01-28: qty 2

## 2015-01-28 NOTE — ED Provider Notes (Signed)
CSN: [id number]     Arrival date & time 01/28/15  1203 History   First MD Initiated Contact with Patient 01/28/15 1211     Chief Complaint  Patient presents with  . Chest Pain  . Shortness of Breath     (Consider location/radiation/quality/duration/timing/severity/associated sxs/prior Treatment) HPI Comments: 39 year old female presenting from her PCPs office with shortness of breath and chest pressure 5 days. Patient endorses shortness of breath, worse on exertion, and constant left-sided, nonradiating chest pressure. Admits to associated headache on the left side of her forehead, described as throbbing along with nausea. States she's had low-grade fevers of 99.4. Denies vomiting, cough or wheezing. She drove to [place] at the end of last month and is on oral contraceptives. No history of blood clots. States occasionally she has a muscle cramp in her right calf that has happened twice in the past month since her trip. States this leg will occasionally swell. Denies any symptoms in her leg at this time. Upstairs at the PCP office she was noted to have an ambulating pulse-ox dropping into the 80s.  Patient is a 39 y.o. female presenting with chest pain and shortness of breath. The history is provided by the patient.  Chest Pain Associated symptoms: headache and shortness of breath   Shortness of Breath Associated symptoms: chest pain and headaches     Past Medical History  Diagnosis Date  . Arthritis   . Blood in stool   . Heart murmur   . Obesity   . Hypertension     history of this, no meds  . Fatty liver   . Hyperplastic colon polyp 10/22/12  . Anxiety   . Thrombocytosis 06/02/2014  . Unspecified deficiency anemia 06/02/2014  . Hypokalemia 06/02/2014  . Depression   . Lung nodule 09/16/2014   Past Surgical History  Procedure Laterality Date  . Knee surgery  3 23 13     right  . Knee arthroscopy      left  . Breast reduction surgery  10/19/08   Family History  Problem  Relation Age of Onset  . Stroke Father   . Hypertension Father   . Diabetes Father   . Hypertension Maternal Grandmother   . Stroke Maternal Grandmother   . Ovarian cancer Paternal Grandmother   . Colon cancer Neg Hx   . Heart disease Paternal Grandfather   . Heart disease Maternal Grandmother   . Stroke Paternal Grandfather    History  Substance Use Topics  . Smoking status: Former Smoker    Types: Cigarettes  . Smokeless tobacco: Never Used  . Alcohol Use: Yes     Comment: occasionally wine 1-2 glasses q2 weeks   OB History    No data available     Review of Systems  Respiratory: Positive for shortness of breath.   Cardiovascular: Positive for chest pain and leg swelling.  Neurological: Positive for headaches.  All other systems reviewed and are negative.     Allergies  Review of patient's allergies indicates no known allergies.  Home Medications   Prior to Admission medications   Medication Sig Start Date End Date Taking? Authorizing Provider  amLODipine (NORVASC) 10 MG tablet Take 1 tablet (10 mg total) by mouth daily. 11/24/14   Dorothy Spark, MD  ferrous sulfate 325 (65 FE) MG tablet Take 325 mg by mouth daily with breakfast.    Historical Provider, MD  FLUoxetine (PROZAC) 20 MG tablet Take 1 tablet (20 mg total) by mouth daily. 09/01/14  [name], MD  hydrochlorothiazide (MICROZIDE) 12.5 MG capsule Take 12.5 mg by mouth every other day.    Historical Provider, MD  Hewitt 28 0.25-35 MG-MCG tablet TAKE 1 TABLET BY MOUTH DAILY. SKIP PLACEBO PILLS AND GO RIGHT INTO NEXT PACK    [name], MD  vitamin B-12 (CYANOCOBALAMIN) 500 MCG tablet Take 500 mcg by mouth daily. 1 every 3 days    Historical Provider, MD   BP 136/90 mmHg  Pulse 76  Resp 11  SpO2 100%  LMP 01/21/2015 Physical Exam  Constitutional: She is oriented to person, place, and time. She appears well-developed and well-nourished. No distress.  HENT:  Head: Normocephalic and  atraumatic.  Mouth/Throat: Oropharynx is clear and moist.  Eyes: Conjunctivae and EOM are normal. Pupils are equal, round, and reactive to light.  Neck: Normal range of motion. Neck supple. No JVD present.  Cardiovascular: Normal rate, regular rhythm, normal heart sounds and intact distal pulses.   No extremity edema.  Pulmonary/Chest: Effort normal and breath sounds normal. No respiratory distress.  Abdominal: Soft. Bowel sounds are normal. There is no tenderness.  Musculoskeletal: Normal range of motion. She exhibits no edema.  No calf tenderness bilateral.  Neurological: She is alert and oriented to person, place, and time. She has normal strength. No cranial nerve deficit or sensory deficit. Coordination and gait normal.  Speech fluent, goal oriented. Moves limbs without ataxia. Equal grip strength bilateral.  Skin: Skin is warm and dry. No rash noted. She is not diaphoretic.  Psychiatric: She has a normal mood and affect. Her behavior is normal.  Nursing note and vitals reviewed.   ED Course  Procedures (including critical care time) Labs Review Labs Reviewed  CBC - Abnormal; Notable for the following:    WBC 13.3 (*)    Platelets 535 (*)    All other components within normal limits  BASIC METABOLIC PANEL - Abnormal; Notable for the following:    Sodium 131 (*)    Creatinine, Ser 1.11 (*)    GFR calc non Af Amer 62 (*)    GFR calc Af Amer 72 (*)    All other components within normal limits  TROPONIN I  D-DIMER, QUANTITATIVE    Imaging Review No results found.   EKG Interpretation   Date/Time:  Thursday January 28 2015 12:09:55 EST Ventricular Rate:  83 PR Interval:  144 QRS Duration: 72 QT Interval:  352 QTC Calculation: 413 R Axis:   45 Text Interpretation:  Normal sinus rhythm Nonspecific T wave abnormality  Abnormal ECG No significant change since last tracing Confirmed by POLLINA   MD, CHRISTOPHER [phone]) on 01/28/2015 1:13:21 PM      MDM   Final  diagnoses:  Shortness of breath  Other chest pain  Frontal headache   Nontoxic appearing and in no apparent distress. AFVSS. No hypoxia noted here in the emergency department. O2 sat 98% on room air. Initial concern for PE, d-dimer within normal limits. Doubt cardiac, HEART score 2. EKG unchanged from prior. HA improved with tylenol. No focal neuro deficits. Doubt SAH, ICH, CVA. Ambulating pulse ox remains between 98-100% when she walks around the entire ED. It is possible the machine at PCP office was not working correctly. Discussed slight elevation in creatinine with pt which has been slightly elevated in the past. She will discuss this with her PCP. Stable for d/c. Return precautions given. Patient states understanding of treatment care plan and is agreeable.  Discussed with attending Dr. Betsey Holiday  who agrees with plan of care.   Carman Ching, PA-C 01/28/15 1322  Orpah Greek, MD 01/28/15 [phone]

## 2015-01-28 NOTE — Discharge Instructions (Signed)
Chest Pain (Nonspecific) °It is often hard to give a specific diagnosis for the cause of chest pain. There is always a chance that your pain could be related to something serious, such as a heart attack or a blood clot in the lungs. You need to follow up with your health care provider for further evaluation. °CAUSES  °· Heartburn. °· Pneumonia or bronchitis. °· Anxiety or stress. °· Inflammation around your heart (pericarditis) or lung (pleuritis or pleurisy). °· A blood clot in the lung. °· A collapsed lung (pneumothorax). It can develop suddenly on its own (spontaneous pneumothorax) or from trauma to the chest. °· Shingles infection (herpes zoster virus). °The chest wall is composed of bones, muscles, and cartilage. Any of these can be the source of the pain. °· The bones can be bruised by injury. °· The muscles or cartilage can be strained by coughing or overwork. °· The cartilage can be affected by inflammation and become sore (costochondritis). °DIAGNOSIS  °Lab tests or other studies may be needed to find the cause of your pain. Your health care provider may have you take a test called an ambulatory electrocardiogram (ECG). An ECG records your heartbeat patterns over a 24-hour period. You may also have other tests, such as: °· Transthoracic echocardiogram (TTE). During echocardiography, sound waves are used to evaluate how blood flows through your heart. °· Transesophageal echocardiogram (TEE). °· Cardiac monitoring. This allows your health care provider to monitor your heart rate and rhythm in real time. °· Holter monitor. This is a portable device that records your heartbeat and can help diagnose heart arrhythmias. It allows your health care provider to track your heart activity for several days, if needed. °· Stress tests by exercise or by giving medicine that makes the heart beat faster. °TREATMENT  °· Treatment depends on what may be causing your chest pain. Treatment may include: °· Acid blockers for  heartburn. °· Anti-inflammatory medicine. °· Pain medicine for inflammatory conditions. °· Antibiotics if an infection is present. °· You may be advised to change lifestyle habits. This includes stopping smoking and avoiding alcohol, caffeine, and chocolate. °· You may be advised to keep your head raised (elevated) when sleeping. This reduces the chance of acid going backward from your stomach into your esophagus. °Most of the time, nonspecific chest pain will improve within 2-3 days with rest and mild pain medicine.  °HOME CARE INSTRUCTIONS  °· If antibiotics were prescribed, take them as directed. Finish them even if you start to feel better. °· For the next few days, avoid physical activities that bring on chest pain. Continue physical activities as directed. °· Do not use any tobacco products, including cigarettes, chewing tobacco, or electronic cigarettes. °· Avoid drinking alcohol. °· Only take medicine as directed by your health care provider. °· Follow your health care provider's suggestions for further testing if your chest pain does not go away. °· Keep any follow-up appointments you made. If you do not go to an appointment, you could develop lasting (chronic) problems with pain. If there is any problem keeping an appointment, call to reschedule. °SEEK MEDICAL CARE IF:  °· Your chest pain does not go away, even after treatment. °· You have a rash with blisters on your chest. °· You have a fever. °SEEK IMMEDIATE MEDICAL CARE IF:  °· You have increased chest pain or pain that spreads to your arm, neck, jaw, back, or abdomen. °· You have shortness of breath. °· You have an increasing cough, or you cough   up blood.  You have severe back or abdominal pain.  You feel nauseous or vomit.  You have severe weakness.  You faint.  You have chills. This is an emergency. Do not wait to see if the pain will go away. Get medical help at once. Call your local emergency services (911 in [place] Do not drive  yourself to the hospital. MAKE SURE YOU:   Understand these instructions.  Will watch your condition.  Will get help right away if you are not doing well or get worse. Document Released: 08/30/2005 Document Revised: 11/25/2013 Document Reviewed: 06/25/2008 [facility] Patient Information 2015 [place], [place]. This information is not intended to replace advice given to you by your health care provider. Make sure you discuss any questions you have with your health care provider.  Headaches, Frequently Asked Questions MIGRAINE HEADACHES Q: What is migraine? What causes it? How can I treat it? A: Generally, migraine headaches begin as a dull ache. Then they develop into a constant, throbbing, and pulsating pain. You may experience pain at the temples. You may experience pain at the front or back of one or both sides of the head. The pain is usually accompanied by a combination of:  Nausea.  Vomiting.  Sensitivity to light and noise. Some people (about 15%) experience an aura (see below) before an attack. The cause of migraine is believed to be chemical reactions in the brain. Treatment for migraine may include over-the-counter or prescription medications. It may also include self-help techniques. These include relaxation training and biofeedback.  Q: What is an aura? A: About 15% of people with migraine get an "aura". This is a sign of neurological symptoms that occur before a migraine headache. You may see wavy or jagged lines, dots, or flashing lights. You might experience tunnel vision or blind spots in one or both eyes. The aura can include visual or auditory hallucinations (something imagined). It may include disruptions in smell (such as strange odors), taste or touch. Other symptoms include:  Numbness.  A "pins and needles" sensation.  Difficulty in recalling or speaking the correct word. These neurological events may last as long as 60 minutes. These symptoms will fade as the headache  begins. Q: What is a trigger? A: Certain physical or environmental factors can lead to or "trigger" a migraine. These include:  Foods.  Hormonal changes.  Weather.  Stress. It is important to remember that triggers are different for everyone. To help prevent migraine attacks, you need to figure out which triggers affect you. Keep a headache diary. This is a good way to track triggers. The diary will help you talk to your healthcare professional about your condition. Q: Does weather affect migraines? A: Bright sunshine, hot, humid conditions, and drastic changes in barometric pressure may lead to, or "trigger," a migraine attack in some people. But studies have shown that weather does not act as a trigger for everyone with migraines. Q: What is the link between migraine and hormones? A: Hormones start and regulate many of your body's functions. Hormones keep your body in balance within a constantly changing environment. The levels of hormones in your body are unbalanced at times. Examples are during menstruation, pregnancy, or menopause. That can lead to a migraine attack. In fact, about three quarters of all women with migraine report that their attacks are related to the menstrual cycle.  Q: Is there an increased risk of stroke for migraine sufferers? A: The likelihood of a migraine attack causing a stroke is very  remote. That is not to say that migraine sufferers cannot have a stroke associated with their migraines. In persons under age 39, the most common associated factor for stroke is migraine headache. But over the course of a person's normal life span, the occurrence of migraine headache may actually be associated with a reduced risk of dying from cerebrovascular disease due to stroke.  Q: What are acute medications for migraine? A: Acute medications are used to treat the pain of the headache after it has started. Examples over-the-counter medications, NSAIDs, ergots, and triptans.  Q:  What are the triptans? A: Triptans are the newest class of abortive medications. They are specifically targeted to treat migraine. Triptans are vasoconstrictors. They moderate some chemical reactions in the brain. The triptans work on receptors in your brain. Triptans help to restore the balance of a neurotransmitter called serotonin. Fluctuations in levels of serotonin are thought to be a main cause of migraine.  Q: Are over-the-counter medications for migraine effective? A: Over-the-counter, or "OTC," medications may be effective in relieving mild to moderate pain and associated symptoms of migraine. But you should see your caregiver before beginning any treatment regimen for migraine.  Q: What are preventive medications for migraine? A: Preventive medications for migraine are sometimes referred to as "prophylactic" treatments. They are used to reduce the frequency, severity, and length of migraine attacks. Examples of preventive medications include antiepileptic medications, antidepressants, beta-blockers, calcium channel blockers, and NSAIDs (nonsteroidal anti-inflammatory drugs). Q: Why are anticonvulsants used to treat migraine? A: During the past few years, there has been an increased interest in antiepileptic drugs for the prevention of migraine. They are sometimes referred to as "anticonvulsants". Both epilepsy and migraine may be caused by similar reactions in the brain.  Q: Why are antidepressants used to treat migraine? A: Antidepressants are typically used to treat people with depression. They may reduce migraine frequency by regulating chemical levels, such as serotonin, in the brain.  Q: What alternative therapies are used to treat migraine? A: The term "alternative therapies" is often used to describe treatments considered outside the scope of conventional Western medicine. Examples of alternative therapy include acupuncture, acupressure, and yoga. Another common alternative treatment is  herbal therapy. Some herbs are believed to relieve headache pain. Always discuss alternative therapies with your caregiver before proceeding. Some herbal products contain arsenic and other toxins. TENSION HEADACHES Q: What is a tension-type headache? What causes it? How can I treat it? A: Tension-type headaches occur randomly. They are often the result of temporary stress, anxiety, fatigue, or anger. Symptoms include soreness in your temples, a tightening band-like sensation around your head (a "vice-like" ache). Symptoms can also include a pulling feeling, pressure sensations, and contracting head and neck muscles. The headache begins in your forehead, temples, or the back of your head and neck. Treatment for tension-type headache may include over-the-counter or prescription medications. Treatment may also include self-help techniques such as relaxation training and biofeedback. CLUSTER HEADACHES Q: What is a cluster headache? What causes it? How can I treat it? A: Cluster headache gets its name because the attacks come in groups. The pain arrives with little, if any, warning. It is usually on one side of the head. A tearing or bloodshot eye and a runny nose on the same side of the headache may also accompany the pain. Cluster headaches are believed to be caused by chemical reactions in the brain. They have been described as the most severe and intense of any headache type. Treatment for  cluster headache includes prescription medication and oxygen. SINUS HEADACHES Q: What is a sinus headache? What causes it? How can I treat it? A: When a cavity in the bones of the face and skull (a sinus) becomes inflamed, the inflammation will cause localized pain. This condition is usually the result of an allergic reaction, a tumor, or an infection. If your headache is caused by a sinus blockage, such as an infection, you will probably have a fever. An x-ray will confirm a sinus blockage. Your caregiver's treatment  might include antibiotics for the infection, as well as antihistamines or decongestants.  REBOUND HEADACHES Q: What is a rebound headache? What causes it? How can I treat it? A: A pattern of taking acute headache medications too often can lead to a condition known as "rebound headache." A pattern of taking too much headache medication includes taking it more than 2 days per week or in excessive amounts. That means more than the label or a caregiver advises. With rebound headaches, your medications not only stop relieving pain, they actually begin to cause headaches. Doctors treat rebound headache by tapering the medication that is being overused. Sometimes your caregiver will gradually substitute a different type of treatment or medication. Stopping may be a challenge. Regularly overusing a medication increases the potential for serious side effects. Consult a caregiver if you regularly use headache medications more than 2 days per week or more than the label advises. ADDITIONAL QUESTIONS AND ANSWERS Q: What is biofeedback? A: Biofeedback is a self-help treatment. Biofeedback uses special equipment to monitor your body's involuntary physical responses. Biofeedback monitors:  Breathing.  Pulse.  Heart rate.  Temperature.  Muscle tension.  Brain activity. Biofeedback helps you refine and perfect your relaxation exercises. You learn to control the physical responses that are related to stress. Once the technique has been mastered, you do not need the equipment any more. Q: Are headaches hereditary? A: Four out of five (80%) of people that suffer report a family history of migraine. Scientists are not sure if this is genetic or a family predisposition. Despite the uncertainty, a child has a 50% chance of having migraine if one parent suffers. The child has a 75% chance if both parents suffer.  Q: Can children get headaches? A: By the time they reach high school, most young people have experienced  some type of headache. Many safe and effective approaches or medications can prevent a headache from occurring or stop it after it has begun.  Q: What type of doctor should I see to diagnose and treat my headache? A: Start with your primary caregiver. Discuss his or her experience and approach to headaches. Discuss methods of classification, diagnosis, and treatment. Your caregiver may decide to recommend you to a headache specialist, depending upon your symptoms or other physical conditions. Having diabetes, allergies, etc., may require a more comprehensive and inclusive approach to your headache. The National Headache Foundation will provide, upon request, a list of [facility] physician members in your state. Document Released: 02/10/2004 Document Revised: 02/12/2012 Document Reviewed: 07/20/2008 [facility] Patient Information 2015 [place], [place]. This information is not intended to replace advice given to you by your health care provider. Make sure you discuss any questions you have with your health care provider.  Shortness of Breath Shortness of breath means you have trouble breathing. It could also mean that you have a medical problem. You should get immediate medical care for shortness of breath. CAUSES   Not enough oxygen in the air such  as with high altitudes or a smoke-filled room.  Certain lung diseases, infections, or problems.  Heart disease or conditions, such as angina or heart failure.  Low red blood cells (anemia).  Poor physical fitness, which can cause shortness of breath when you exercise.  Chest or back injuries or stiffness.  Being overweight.  Smoking.  Anxiety, which can make you feel like you are not getting enough air. DIAGNOSIS  Serious medical problems can often be found during your physical exam. Tests may also be done to determine why you are having shortness of breath. Tests may include:  Chest X-rays.  Lung function tests.  Blood tests.  An  electrocardiogram (ECG).  An ambulatory electrocardiogram. An ambulatory ECG records your heartbeat patterns over a 24-hour period.  Exercise testing.  A transthoracic echocardiogram (TTE). During echocardiography, sound waves are used to evaluate how blood flows through your heart.  A transesophageal echocardiogram (TEE).  Imaging scans. Your health care provider may not be able to find a cause for your shortness of breath after your exam. In this case, it is important to have a follow-up exam with your health care provider as directed.  TREATMENT  Treatment for shortness of breath depends on the cause of your symptoms and can vary greatly. HOME CARE INSTRUCTIONS   Do not smoke. Smoking is a common cause of shortness of breath. If you smoke, ask for help to quit.  Avoid being around chemicals or things that may bother your breathing, such as paint fumes and dust.  Rest as needed. Slowly resume your usual activities.  If medicines were prescribed, take them as directed for the full length of time directed. This includes oxygen and any inhaled medicines.  Keep all follow-up appointments as directed by your health care provider. SEEK MEDICAL CARE IF:   Your condition does not improve in the time expected.  You have a hard time doing your normal activities even with rest.  You have any new symptoms. SEEK IMMEDIATE MEDICAL CARE IF:   Your shortness of breath gets worse.  You feel light-headed, faint, or develop a cough not controlled with medicines.  You start coughing up blood.  You have pain with breathing.  You have chest pain or pain in your arms, shoulders, or abdomen.  You have a fever.  You are unable to walk up stairs or exercise the way you normally do. MAKE SURE YOU:  Understand these instructions.  Will watch your condition.  Will get help right away if you are not doing well or get worse. Document Released: 08/15/2001 Document Revised: 11/25/2013 Document  Reviewed: 02/05/2012 [facility] Patient Information 2015 [place], [place]. This information is not intended to replace advice given to you by your health care provider. Make sure you discuss any questions you have with your health care provider.

## 2015-01-28 NOTE — Progress Notes (Signed)
Pre visit review using our clinic review tool, if applicable. No additional management support is needed unless otherwise documented below in the visit note. 

## 2015-01-28 NOTE — ED Notes (Signed)
She was sent from her MD's office upstairs with c.o chest pressure, headache and dyspnea with activity since Sunday.

## 2015-01-28 NOTE — Patient Instructions (Signed)
Chest pressure On left side of chest with dyspnea. Hx of htn, dad mi hx, and overweight.   Symptoms since Sunday + Monday and persists. With pulse increase to mid 120 on short walk and o2% drop to 87% I do think ED evaluation for stat labs would be beneficial.   Please go down stairs to ED. I have talked with Charge nurse and notified ED of recent presentation.   Headache Probable recent migraine. Faint ha now. ED evaluation today. Primarily for chest pressure and sob. They may do work up of ha if they think it is indicated.    Follow up here as need post ED eval.

## 2015-01-28 NOTE — ED Notes (Signed)
Pt states she drove to [place] the end of January and is on birth control.

## 2015-01-28 NOTE — Progress Notes (Signed)
before

## 2015-01-28 NOTE — Progress Notes (Signed)
Subjective:    Patient ID: Pamela Clarke, female    DOB: [birth year], 39 y.o.   MRN: [id number]  HPI  Pt in states Monday night she got a head ache. Sound and light sensitive. Nausea on Monday night as well.   Wed pain was moderate and had to leave work early. Pt ha level 4/10 now. On Monday 9-10/10 level ha.   On Monday took excedrin, ibuprophen and tried to sleep it off.   Pt has hx of migraines but none in 10 yrs.  Since Monday on review. NO vision changes, slurred speech, no aphasia, no numbness weakness of arms or legs.    She also had some mild sob on Sunday. Hx of mild shortness of breath of and on for one year. No asthma. NO smoking history. No severe coughing at night. She does report in past remote hx of using inhalers when sick with uri or bronchitis 1-2 times. No recent respiratory illness. Pt has no pain in her legs.   Since Sunday laying down on her left side and her chest wall was hurting. Pt still has some mild chest pain/pressure. Pt has some htn, and overweight. Pt father had MI at 3-4 years ago. At age 83. Pt denies hyperlipidemia.  Sunday she had chest and shoulder pain. Going up stairs extremley winded woking up stairs.     Review of Systems  Constitutional: Negative for fever, chills, diaphoresis, activity change and fatigue.  Respiratory: Positive for shortness of breath. Negative for cough and chest tightness.   Cardiovascular: Positive for chest pain. Negative for palpitations and leg swelling.  Gastrointestinal: Negative for nausea, vomiting and abdominal pain.  Musculoskeletal: Negative for back pain, neck pain and neck stiffness.  Neurological: Positive for headaches. Negative for dizziness, tremors, seizures, syncope, facial asymmetry, speech difficulty, weakness, light-headedness and numbness.  Psychiatric/Behavioral: Negative for behavioral problems, confusion and agitation. The patient is not nervous/anxious.    Past Medical History  Diagnosis  Date  . Arthritis   . Blood in stool   . Heart murmur   . Obesity   . Hypertension     history of this, no meds  . Fatty liver   . Hyperplastic colon polyp 10/22/12  . Anxiety   . Thrombocytosis 06/02/2014  . Unspecified deficiency anemia 06/02/2014  . Hypokalemia 06/02/2014  . Depression   . Lung nodule 09/16/2014    History   Social History  . Marital Status: Single    Spouse Name: N/A  . Number of Children: 0  . Years of Education: N/A   Occupational History  . pharmacy tech    Social History Main Topics  . Smoking status: Former Smoker    Types: Cigarettes  . Smokeless tobacco: Never Used  . Alcohol Use: Yes     Comment: occasionally wine 1-2 glasses q2 weeks  . Drug Use: No  . Sexual Activity: Yes    Birth Control/ Protection: None   Other Topics Concern  . Not on file   Social History Narrative   Lives w/ female partner    Past Surgical History  Procedure Laterality Date  . Knee surgery  3 23 13     right  . Knee arthroscopy      left  . Breast reduction surgery  10/19/08    Family History  Problem Relation Age of Onset  . Stroke Father   . Hypertension Father   . Diabetes Father   . Hypertension Maternal Grandmother   . Stroke Maternal Grandmother   .  Ovarian cancer Paternal Grandmother   . Colon cancer Neg Hx   . Heart disease Paternal Grandfather   . Heart disease Maternal Grandmother   . Stroke Paternal Grandfather     No Known Allergies  No current facility-administered medications on file prior to visit.   Current Outpatient Prescriptions on File Prior to Visit  Medication Sig Dispense Refill  . amLODipine (NORVASC) 10 MG tablet Take 1 tablet (10 mg total) by mouth daily. 90 tablet 1  . ferrous sulfate 325 (65 FE) MG tablet Take 325 mg by mouth daily with breakfast.    . FLUoxetine (PROZAC) 20 MG tablet Take 1 tablet (20 mg total) by mouth daily. 90 tablet 1  . hydrochlorothiazide (MICROZIDE) 12.5 MG capsule Take 12.5 mg by mouth  every other day.    . SPRINTEC 28 0.25-35 MG-MCG tablet TAKE 1 TABLET BY MOUTH DAILY. SKIP PLACEBO PILLS AND GO RIGHT INTO NEXT PACK 112 tablet 3  . vitamin B-12 (CYANOCOBALAMIN) 500 MCG tablet Take 500 mcg by mouth daily. 1 every 3 days      BP 140/80 mmHg  Pulse 96  Temp(Src) 98.8 F (37.1 C) (Oral)  Ht 5\' 4"  (1.626 m)  Wt 213 lb 12.8 oz (96.979 kg)  BMI 36.68 kg/m2  SpO2 98%  LMP 01/21/2015       Objective:   Physical Exam  General Mental Status- Alert. General Appearance- Not in acute distress.   Skin General: Color- Normal Color. Moisture- Normal Moisture.  Neck Carotid Arteries- Normal color. Moisture- Normal Moisture. No carotid bruits. No JVD.  Chest and Lung Exam Auscultation: Breath Sounds:-even and unlabored. Mild shallow.  Cardiovascular Auscultation:Rythm- Regular,rate and rhythm. Murmurs & Other Heart Sounds:Auscultation of the heart reveals- No Murmurs.  Abdomen Inspection:-Inspeection Normal. Palpation/Percussion:Note:No mass. Palpation and Percussion of the abdomen reveal- Non Tender, Non Distended + BS, no rebound or guarding.    Neurologic Cranial Nerve exam:- CN III-XII intact(No nystagmus), symmetric smile. Strength:- 5/5 equal and symmetric strength both upper and lower extremities.  Lower ext- no pedal edema. No homans signs.      Assessment & Plan:

## 2015-01-28 NOTE — Assessment & Plan Note (Signed)
Probable recent migraine. Faint ha now. ED evaluation today. Primarily for chest pressure and sob. They may do work up of ha if they think it is indicated.

## 2015-01-28 NOTE — Assessment & Plan Note (Signed)
On left side of chest with dyspnea. Hx of htn, dad mi hx, and overweight.   Symptoms since Sunday + Monday and persists. With pulse increase to mid 120 on short walk and o2% drop to 87% I do think ED evaluation for stat labs would be beneficial.   Please go down stairs to ED. I have talked with Charge nurse and notified ED of recent presentation.

## 2015-01-28 NOTE — ED Notes (Signed)
Patient ambulated in hallway, room air, SpO2 98-100%, HR 95-102, RR 18-22, no increased SOB,

## 2015-02-08 ENCOUNTER — Other Ambulatory Visit: Payer: Self-pay | Admitting: Family Medicine

## 2015-02-08 DIAGNOSIS — N6489 Other specified disorders of breast: Secondary | ICD-10-CM

## 2015-02-08 DIAGNOSIS — N63 Unspecified lump in unspecified breast: Secondary | ICD-10-CM

## 2015-02-17 ENCOUNTER — Ambulatory Visit
Admission: RE | Admit: 2015-02-17 | Discharge: 2015-02-17 | Disposition: A | Discharge status: Home / Self Care | Payer: BLUE CROSS/BLUE SHIELD | Source: Ambulatory Visit | Attending: Family Medicine | Admitting: Family Medicine

## 2015-02-17 DIAGNOSIS — N6489 Other specified disorders of breast: Secondary | ICD-10-CM

## 2015-02-17 DIAGNOSIS — N63 Unspecified lump in unspecified breast: Secondary | ICD-10-CM

## 2015-02-25 ENCOUNTER — Other Ambulatory Visit: Payer: Self-pay | Admitting: Family Medicine

## 2015-02-25 NOTE — Telephone Encounter (Signed)
Med filled.  

## 2015-02-27 ENCOUNTER — Other Ambulatory Visit: Payer: Self-pay | Admitting: Family Medicine

## 2015-04-07 IMAGING — CT CT CHEST W/ CM
1 of 2 series · 14 of 31 positions shown, 18 images · IV contrast (OMNIPAQUE)
Comparison: Chest CT 03/13/2014.

CLINICAL DATA: 37-year-old female smoker with a left axillary
adenopathy and occasional bilateral lower extremity swelling
complaining of 20 pound weight loss in the past 5 months and night
sweats. Follow up evaluation of pulmonary nodules noted on prior CT
scan.

EXAM:
CT CHEST WITH CONTRAST
TECHNIQUE: Multidetector CT imaging of the chest was performed during
intravenous contrast administration.
CONTRAST:  80mL OMNIPAQUE IOHEXOL 300 MG/ML  SOLN

[Series 2: rtn chest with st · axial · 0.66mm/px · z∈[+1124,+1354]mm · 14 of 56 slices shown, 18 images]
[im 5/56  mediastinal]
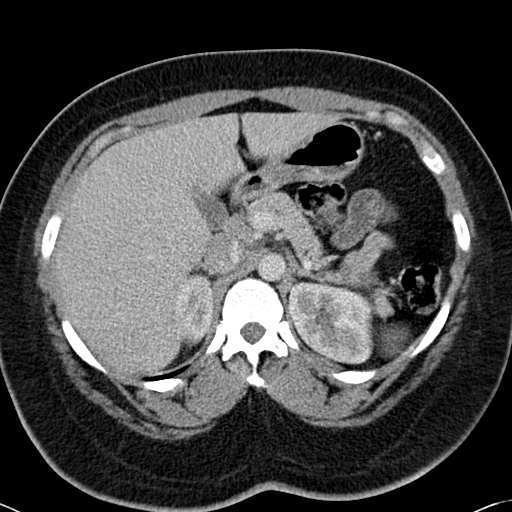
[im 5/56  lung]
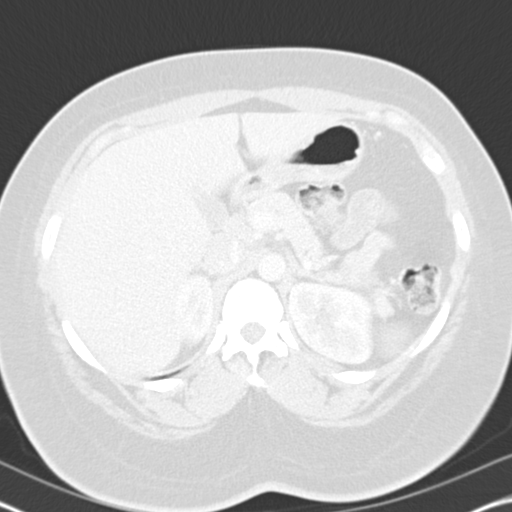
[im 9/56  lung]
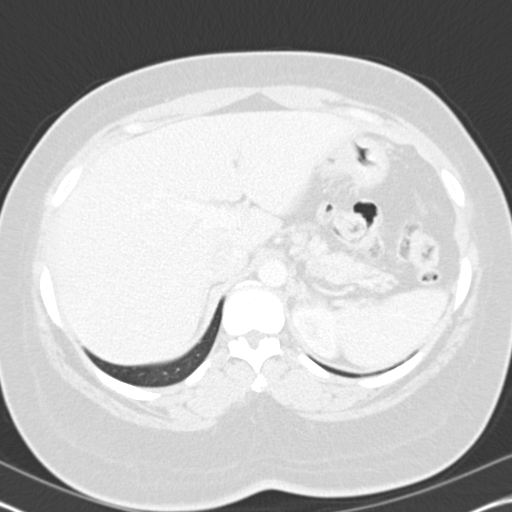
[im 13/56  lung]
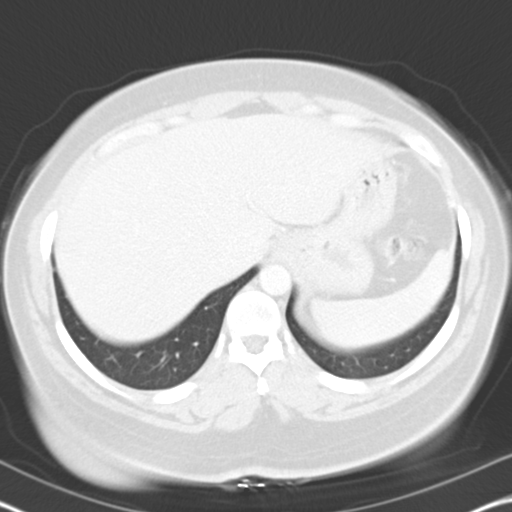
[im 17/56  lung]
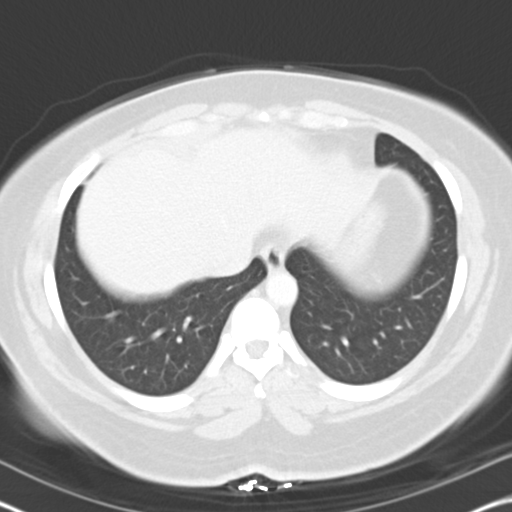
[im 22/56  mediastinal]
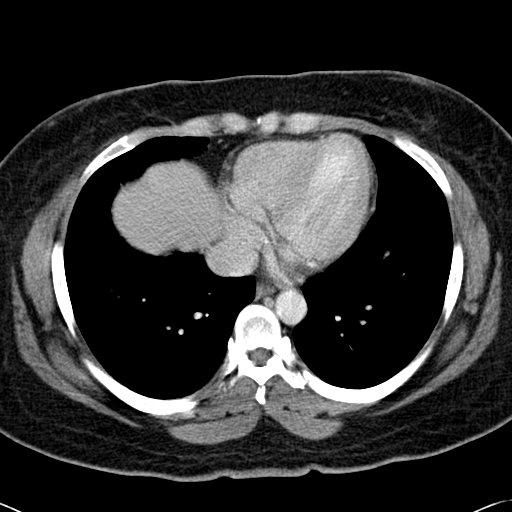
[im 22/56  lung]
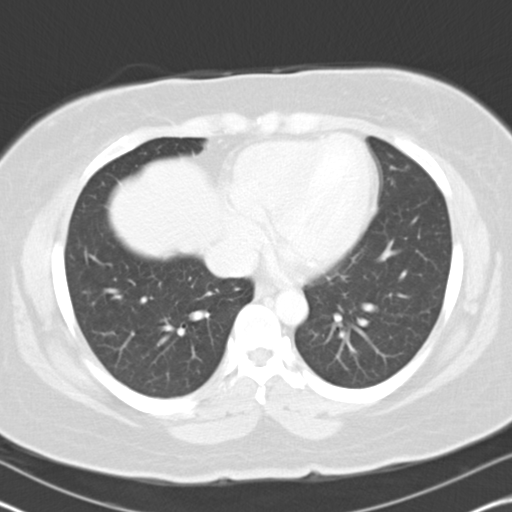
[im 26/56  lung]
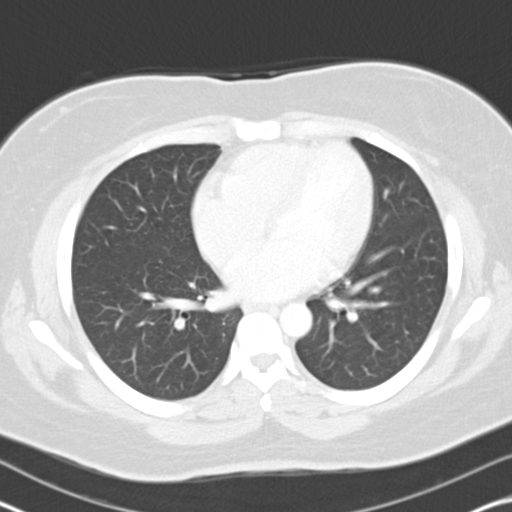
[im 27/56  lung]
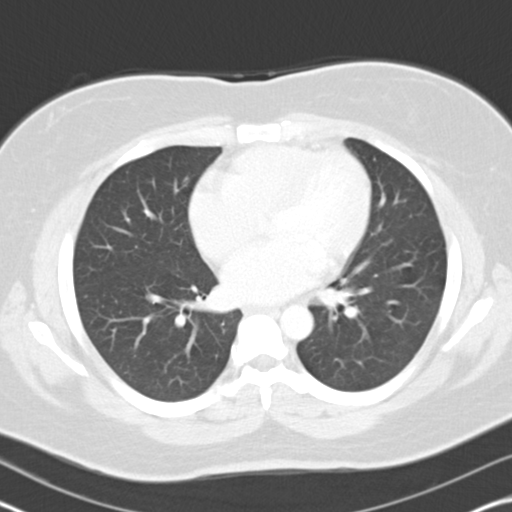
[im 28/56  lung]
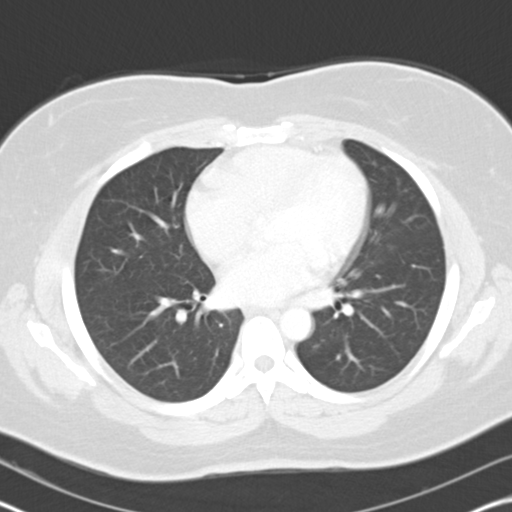
[im 30/56  mediastinal]
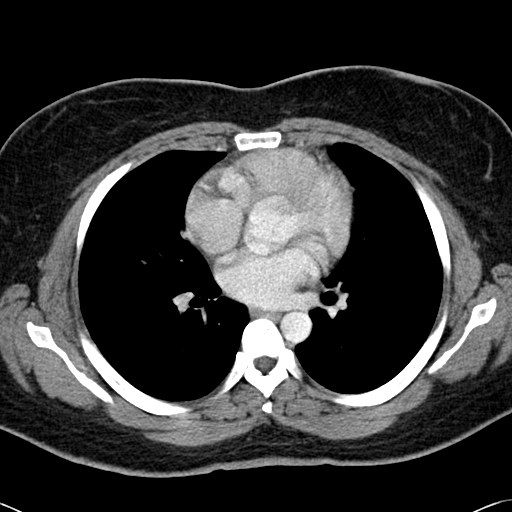
[im 30/56  lung]
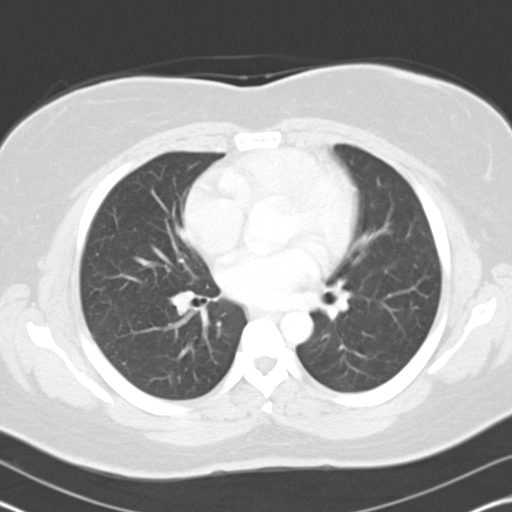
[im 34/56  lung]
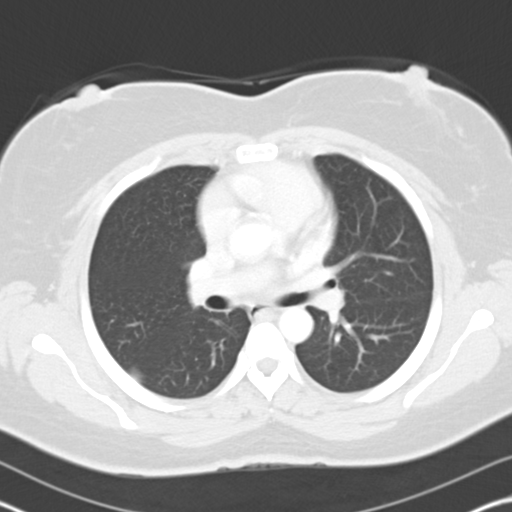
[im 39/56  lung]
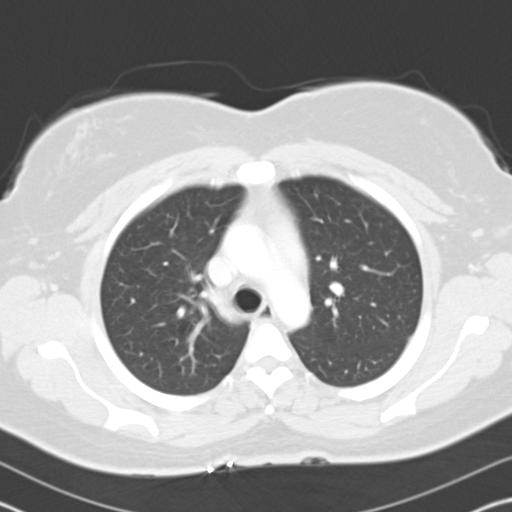
[im 43/56  lung]
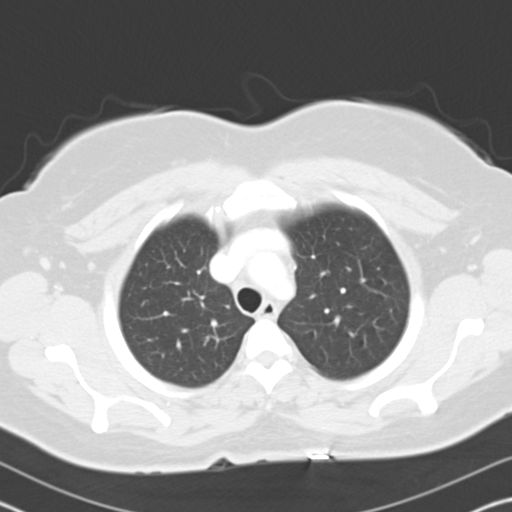
[im 47/56  mediastinal]
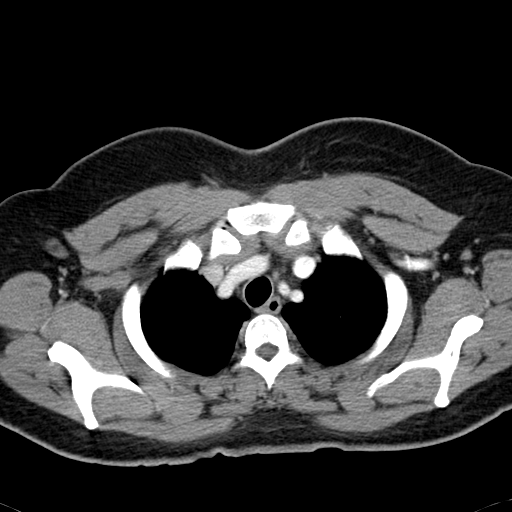
[im 47/56  lung]
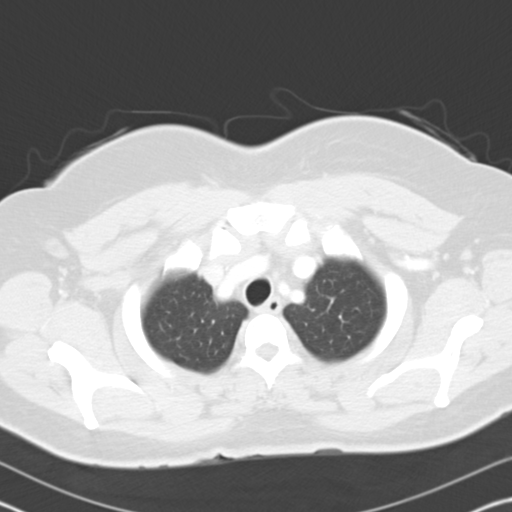
[im 51/56  lung]
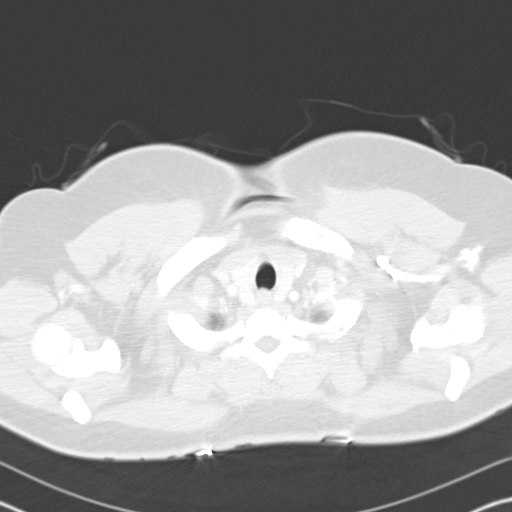

[14 of 31 positions shown; findings below may reference images not displayed]

FINDINGS: Mediastinum: Heart size is normal. There is no significant
pericardial fluid, thickening or pericardial calcification. No
pathologically enlarged mediastinal or hilar lymph nodes. Esophagus
is unremarkable in appearance.

Lungs/Pleura: 3 mm and 5 mm nodules along the left major fissure on
images 20 for and 28 of series 5 are unchanged, and statistically
likely benign subpleural lymph nodes in this young individual. No
other suspicious appearing pulmonary nodules or masses are otherwise
noted. No acute consolidative airspace disease. No pleural
effusions.

Upper Abdomen: Unremarkable.

Musculoskeletal: There are no aggressive appearing lytic or blastic
lesions noted in the visualized portions of the skeleton.
IMPRESSION: 1. No change in tiny 3 and 5 mm pulmonary nodules along the left
major fissure. Given their small size, stability over the
intervening 6 month time period, and characteristic location
associated with the fissures these are most consistent with benign
subpleural lymph nodes. Particularly in light of the patient's young
age, no further imaging is recommended, as the potential risks of
radiation exposure are higher than the likelihood of malignancy for
either of these lesions.

## 2015-05-18 ENCOUNTER — Other Ambulatory Visit (HOSPITAL_BASED_OUTPATIENT_CLINIC_OR_DEPARTMENT_OTHER): Payer: 59

## 2015-05-18 ENCOUNTER — Telehealth: Payer: Self-pay | Admitting: Hematology and Oncology

## 2015-05-18 ENCOUNTER — Encounter: Payer: Self-pay | Admitting: Hematology and Oncology

## 2015-05-18 ENCOUNTER — Ambulatory Visit (HOSPITAL_BASED_OUTPATIENT_CLINIC_OR_DEPARTMENT_OTHER): Payer: 59 | Admitting: Hematology and Oncology

## 2015-05-18 VITALS — BP 141/85 | HR 99 | Temp 98.7°F | Resp 18 | Ht 64.0 in | Wt 219.5 lb

## 2015-05-18 DIAGNOSIS — F4323 Adjustment disorder with mixed anxiety and depressed mood: Secondary | ICD-10-CM | POA: Insufficient documentation

## 2015-05-18 DIAGNOSIS — D473 Essential (hemorrhagic) thrombocythemia: Secondary | ICD-10-CM

## 2015-05-18 DIAGNOSIS — R5382 Chronic fatigue, unspecified: Secondary | ICD-10-CM | POA: Diagnosis not present

## 2015-05-18 DIAGNOSIS — D75839 Thrombocytosis, unspecified: Secondary | ICD-10-CM

## 2015-05-18 DIAGNOSIS — D649 Anemia, unspecified: Secondary | ICD-10-CM

## 2015-05-18 DIAGNOSIS — R590 Localized enlarged lymph nodes: Secondary | ICD-10-CM | POA: Diagnosis not present

## 2015-05-18 DIAGNOSIS — D509 Iron deficiency anemia, unspecified: Secondary | ICD-10-CM | POA: Insufficient documentation

## 2015-05-18 HISTORY — DX: Localized enlarged lymph nodes: R59.0

## 2015-05-18 LAB — CBC & DIFF AND RETIC
BASO%: 0.5 % (ref 0.0–2.0)
Basophils Absolute: 0 10*3/uL (ref 0.0–0.1)
EOS ABS: 0.3 10*3/uL (ref 0.0–0.5)
EOS%: 3.5 % (ref 0.0–7.0)
HCT: 36.4 % (ref 34.8–46.6)
HEMOGLOBIN: 11.7 g/dL (ref 11.6–15.9)
IMMATURE RETIC FRACT: 3.9 % (ref 1.60–10.00)
LYMPH%: 25.5 % (ref 14.0–49.7)
MCH: 28.1 pg (ref 25.1–34.0)
MCHC: 32.1 g/dL (ref 31.5–36.0)
MCV: 87.3 fL (ref 79.5–101.0)
MONO#: 0.9 10*3/uL (ref 0.1–0.9)
MONO%: 12.1 % (ref 0.0–14.0)
NEUT%: 58.4 % (ref 38.4–76.8)
NEUTROS ABS: 4.4 10*3/uL (ref 1.5–6.5)
Platelets: 465 10*3/uL — ABNORMAL HIGH (ref 145–400)
RBC: 4.17 10*6/uL (ref 3.70–5.45)
RDW: 12.1 % (ref 11.2–14.5)
Retic %: 1.9 % (ref 0.70–2.10)
Retic Ct Abs: 79.23 10*3/uL (ref 33.70–90.70)
WBC: 7.5 10*3/uL (ref 3.9–10.3)
lymph#: 1.9 10*3/uL (ref 0.9–3.3)

## 2015-05-18 LAB — FERRITIN CHCC: Ferritin: 129 ng/ml (ref 9–269)

## 2015-05-18 LAB — IRON AND TIBC CHCC
%SAT: 15 % — AB (ref 21–57)
Iron: 49 ug/dL (ref 41–142)
TIBC: 325 ug/dL (ref 236–444)
UIBC: 276 ug/dL (ref 120–384)

## 2015-05-18 LAB — SEDIMENTATION RATE: SED RATE: 16 mm/h (ref 0–20)

## 2015-05-18 NOTE — Assessment & Plan Note (Signed)
All her workup so far is negative for myeloproliferative disorder. I suspect it was reactive in nature. I do not recommend bone marrow aspirate and biopsy. I recommend aspirin therapy to reduce risk of blood clot Overall it is improving. No further workup or follow-up is needed.

## 2015-05-18 NOTE — Assessment & Plan Note (Signed)
She has significant fatigue and is not able to function normally. She has extensive workup in the past. Thyroid function test performed last her was within normal limits. I suspect she has an element of deconditioning. She has seen various specialists and has extensive investigation from the pulmonary, cardiovascular, infectious disease and hematology standpoint and yet she is not satisfied that she was told that most of her tests were within normal limits. Unfortunately, I am not able to help the patient further. If she is interested for second opinion elsewhere, I will be happy to refer her to be seen elsewhere.

## 2015-05-18 NOTE — Telephone Encounter (Signed)
cld pt and left a message of time & date of appt w/Dr Dalbert Batman per Denyse Amass w/[facility]-6/29 @1 :30

## 2015-05-18 NOTE — Assessment & Plan Note (Signed)
She is convinced that recent biopsy missed the lump that she is feeling. She requests a second opinion to see a surgeon to address her concern because "she does not want to die from cancer".

## 2015-05-18 NOTE — Progress Notes (Signed)
[place] OFFICE PROGRESS NOTE  Pamela Asa, MD SUMMARY OF HEMATOLOGIC HISTORY:  She was found to have abnormal CBC from routine blood work. She has been feeling unwell with nonspecific shortness of breath, chest pain, fatigue, anorexia and 15 pound weight loss over the past 1-1/2 months. She also has some frequent night sweats. She has dry, nonproductive cough over the last 2 months of unknown etiology. She had passage of blood in his stool on a regular basis every week over the past few years. She had colonoscopy done several years ago which show only colon polyps. She also complained of back spasm. CT angiogram dated 03/13/2014 show no evidence of pneumonia or PE. 2 nonspecific lung nodules were found. Echocardiogram dated 04/02/2014 show normal ejection fraction. Pulmonary function testing done on 04/23/2014 were unremarkable. Blood work for BCR/ABL, JAK2 mutation and MPL were negative. In September 2015, she was found to have palpable lump in the axillary left axilla. Biopsy was negative INTERVAL HISTORY: Pamela Clarke 39 y.o. female returns for further follow-up. She is very tearful. The patient denies any recent signs or symptoms of bleeding such as spontaneous epistaxis, hematuria or hematochezia. She felt persistent lymphadenopathy in the left axilla. She continues to have shortness of breath on minimal exertion. She of intermittent chest pain with negative workup. She denies depression or suicidal ideation. She is not able to exercise the way she used to be able to. She does not feel right and felt that nobody is paying attention to her needs. She has low-grade fever of 56F with congestion and cough recently. The patient is concerned that she have problems but yet all her recent workup including mammogram, ultrasound, biopsy, echocardiogram and CT scan were within normal limits. She has seen infectious disease specialist, cardiologist, pulmonologist and  gastroenterologist in the past.  I have reviewed the past medical history, past surgical history, social history and family history with the patient and they are unchanged from previous note.  ALLERGIES:  has No Known Allergies.  MEDICATIONS:  Current Outpatient Prescriptions  Medication Sig Dispense Refill  . amLODipine (NORVASC) 10 MG tablet Take 1 tablet (10 mg total) by mouth daily. 90 tablet 1  . ferrous sulfate 325 (65 FE) MG tablet Take 325 mg by mouth daily with breakfast.    . FLUoxetine (PROZAC) 20 MG tablet TAKE 1 TABLET (20 MG TOTAL) BY MOUTH DAILY. 90 tablet 0  . hydrochlorothiazide (MICROZIDE) 12.5 MG capsule Take 12.5 mg by mouth every other day.    . SPRINTEC 28 0.25-35 MG-MCG tablet TAKE 1 TABLET BY MOUTH DAILY. SKIP PLACEBO PILLS AND GO RIGHT INTO NEXT PACK 112 tablet 3  . vitamin B-12 (CYANOCOBALAMIN) 500 MCG tablet Take 500 mcg by mouth daily. 1 every 3 days     No current facility-administered medications for this visit.     REVIEW OF SYSTEMS:   Eyes: Denies blurriness of vision Ears, nose, mouth, throat, and face: Denies mucositis or sore throat Cardiovascular: Denies palpitation, chest discomfort or lower extremity swelling Gastrointestinal:  Denies nausea, heartburn or change in bowel habits Skin: Denies abnormal skin rashes Neurological:Denies numbness, tingling or new weaknesses Behavioral/Psych: Mood is stable, no new changes  All other systems were reviewed with the patient and are negative.  PHYSICAL EXAMINATION: ECOG PERFORMANCE STATUS: 1 - Symptomatic but completely ambulatory  Filed Vitals:   05/18/15 0941  BP: 141/85  Pulse: 99  Temp: 98.7 F (37.1 C)  Resp: 18   Filed Weights   05/18/15 0941  Weight: 219 lb 8 oz (99.565 kg)    GENERAL:alert, no distress and comfortable. She is mildly overweight SKIN: skin color, texture, turgor are normal, no rashes or significant lesions EYES: normal, Conjunctiva are pink and non-injected, sclera  clear Musculoskeletal:no cyanosis of digits and no clubbing  NEURO: alert & oriented x 3 with fluent speech, no focal motor/sensory deficits. She is very tearful  LABORATORY DATA:  I have reviewed the data as listed Results for orders placed or performed in visit on 05/18/15 (from the past 48 hour(s))  Iron and TIBC     Status: Abnormal   Collection Time: 05/18/15  9:29 AM  Result Value Ref Range   Iron 49 41 - 142 ug/dL   TIBC 325 236 - 444 ug/dL   UIBC 276 120 - 384 ug/dL   %SAT 15 (L) 21 - 57 %  Ferritin     Status: None   Collection Time: 05/18/15  9:29 AM  Result Value Ref Range   Ferritin 129 9 - 269 ng/ml  CBC & Diff and Retic     Status: Abnormal   Collection Time: 05/18/15  9:29 AM  Result Value Ref Range   WBC 7.5 3.9 - 10.3 10e3/uL   NEUT# 4.4 1.5 - 6.5 10e3/uL   HGB 11.7 11.6 - 15.9 g/dL   HCT 36.4 34.8 - 46.6 %   Platelets 465 (H) 145 - 400 10e3/uL   MCV 87.3 79.5 - 101.0 fL   MCH 28.1 25.1 - 34.0 pg   MCHC 32.1 31.5 - 36.0 g/dL   RBC 4.17 3.70 - 5.45 10e6/uL   RDW 12.1 11.2 - 14.5 %   lymph# 1.9 0.9 - 3.3 10e3/uL   MONO# 0.9 0.1 - 0.9 10e3/uL   Eosinophils Absolute 0.3 0.0 - 0.5 10e3/uL   Basophils Absolute 0.0 0.0 - 0.1 10e3/uL   NEUT% 58.4 38.4 - 76.8 %   LYMPH% 25.5 14.0 - 49.7 %   MONO% 12.1 0.0 - 14.0 %   EOS% 3.5 0.0 - 7.0 %   BASO% 0.5 0.0 - 2.0 %   Retic % 1.90 0.70 - 2.10 %   Retic Ct Abs 79.23 33.70 - 90.70 10e3/uL   Immature Retic Fract 3.90 1.60 - 10.00 %    Lab Results  Component Value Date   WBC 7.5 05/18/2015   HGB 11.7 05/18/2015   HCT 36.4 05/18/2015   MCV 87.3 05/18/2015   PLT 465* 05/18/2015   ASSESSMENT & PLAN:  Thrombocytosis All her workup so far is negative for myeloproliferative disorder. I suspect it was reactive in nature. I do not recommend bone marrow aspirate and biopsy. I recommend aspirin therapy to reduce risk of blood clot Overall it is improving. No further workup or follow-up is needed.    Axillary  lymphadenopathy She is convinced that recent biopsy missed the lump that she is feeling. She requests a second opinion to see a surgeon to address her concern because "she does not want to die from cancer".  Chronic fatigue She has significant fatigue and is not able to function normally. She has extensive workup in the past. Thyroid function test performed last her was within normal limits. I suspect she has an element of deconditioning. She has seen various specialists and has extensive investigation from the pulmonary, cardiovascular, infectious disease and hematology standpoint and yet she is not satisfied that she was told that most of her tests were within normal limits. Unfortunately, I am not able to help the patient  further. If she is interested for second opinion elsewhere, I will be happy to refer her to be seen elsewhere.   All questions were answered. The patient knows to call the clinic with any problems, questions or concerns. No barriers to learning was detected.  I spent 15 minutes counseling the patient face to face. The total time spent in the appointment was 20 minutes and more than 50% was on counseling.     [facility], Pamela Pelot, MD 6/14/20161:39 PM

## 2015-05-18 NOTE — Telephone Encounter (Signed)
per pof to sch pt referral w/[facility]-cld left message for Denyse Amass w/pt info and my # to call back w/sch-adv pt will call once reply

## 2015-05-24 ENCOUNTER — Other Ambulatory Visit: Payer: Self-pay | Admitting: Cardiology

## 2015-05-25 NOTE — Telephone Encounter (Signed)
HCTZ profile is 12.5 mg every other day

## 2015-06-02 ENCOUNTER — Other Ambulatory Visit: Payer: Self-pay | Admitting: General Surgery

## 2015-06-02 DIAGNOSIS — R590 Localized enlarged lymph nodes: Secondary | ICD-10-CM

## 2015-06-03 ENCOUNTER — Encounter: Payer: Self-pay | Admitting: Family Medicine

## 2015-06-03 ENCOUNTER — Other Ambulatory Visit: Payer: Self-pay | Admitting: *Deleted

## 2015-06-04 ENCOUNTER — Other Ambulatory Visit: Payer: Self-pay | Admitting: Family Medicine

## 2015-06-04 NOTE — Telephone Encounter (Signed)
Last OV 08-18-14 prozac last filled 03/01/15 #90 with 0

## 2015-06-08 ENCOUNTER — Other Ambulatory Visit: Payer: 59

## 2015-06-09 ENCOUNTER — Ambulatory Visit
Admission: RE | Admit: 2015-06-09 | Discharge: 2015-06-09 | Disposition: A | Discharge status: Home / Self Care | Payer: 59 | Source: Ambulatory Visit | Attending: General Surgery | Admitting: General Surgery

## 2015-06-09 MED ORDER — IOPAMIDOL (ISOVUE-300) INJECTION 61%
75.0000 mL | Freq: Once | INTRAVENOUS | Status: AC | PRN
  Administered 2015-06-09: 75 mL via INTRAVENOUS

## 2015-06-15 ENCOUNTER — Encounter: Payer: Self-pay | Admitting: Family Medicine

## 2015-07-09 ENCOUNTER — Ambulatory Visit: Payer: 59 | Admitting: Family Medicine

## 2015-07-22 ENCOUNTER — Encounter: Payer: Self-pay | Admitting: Family Medicine

## 2015-07-22 ENCOUNTER — Telehealth: Payer: Self-pay | Admitting: Family Medicine

## 2015-07-22 ENCOUNTER — Ambulatory Visit (INDEPENDENT_AMBULATORY_CARE_PROVIDER_SITE_OTHER): Payer: 59 | Admitting: Family Medicine

## 2015-07-22 VITALS — BP 132/80 | HR 99 | Temp 98.2°F | Resp 16 | Ht 64.0 in | Wt 232.1 lb

## 2015-07-22 DIAGNOSIS — R5382 Chronic fatigue, unspecified: Secondary | ICD-10-CM | POA: Diagnosis not present

## 2015-07-22 DIAGNOSIS — R609 Edema, unspecified: Secondary | ICD-10-CM | POA: Insufficient documentation

## 2015-07-22 DIAGNOSIS — I10 Essential (primary) hypertension: Secondary | ICD-10-CM | POA: Diagnosis not present

## 2015-07-22 MED ORDER — CETIRIZINE HCL 10 MG PO TABS: 10.0000 mg | ORAL_TABLET | Freq: Every day | ORAL | Status: DC

## 2015-07-22 MED ORDER — AMLODIPINE BESYLATE 5 MG PO TABS: 5.0000 mg | ORAL_TABLET | Freq: Every day | ORAL | Status: DC

## 2015-07-22 NOTE — Telephone Encounter (Signed)
[facility]S 07/22/15 to f/u in 3-4 weeks to recheck bp and swelling, only available appts were acute and same day, used same day 08/18/15 8:30am.

## 2015-07-22 NOTE — Assessment & Plan Note (Signed)
Chronic problem, adequate control but pt complains of LE edema.  Will decrease Amlodipine to 5mg  daily and monitor for elevated BP.  Pt expressed understanding and is in agreement w/ plan.

## 2015-07-22 NOTE — Assessment & Plan Note (Signed)
New.  No obvious edema today but pt reports this is ongoing issue for her.  Will decrease Amlodipine to 5mg  to monitor for swelling improvement.  Will need close f/u to monitor for elevated BP.  Pt expressed understanding and is in agreement w/ plan.

## 2015-07-22 NOTE — Assessment & Plan Note (Signed)
Chronic problem for pt.  Pt has been seen by multiple specialists- Cards, PFTs, ID, rheum, Heme/Onc, GI- w/o obvious cause of sxs.  Check labs to r/o metabolic cause.  Unclear if this is fibro, psychiatric, or an unknown physical ailment.  Will continue to follow.

## 2015-07-22 NOTE — Patient Instructions (Signed)
Follow up in 3-4 weeks to recheck BP and swelling We'll notify you of your lab results and make any changes if needed Decrease Amlodipine to 5mg  daily- 1/2 of what you have at home, 1 of the new prescription Start the Zyrtec daily Drink plenty of fluids Call with any questions or concerns Hang in there!

## 2015-07-22 NOTE — Progress Notes (Signed)
   Subjective:    Patient ID: Pamela Clarke, female    DOB: [birth year], 39 y.o.   MRN: [id number]  HPI F/u- 'i still don't feel better'.  Pt was tx'd for bronchitis and sinus infxn in June and has not had night sweats since then.  Reports ongoing fatigue, leg swelling.  'it feels like something in my throat'. 'i have pains, well they're not really pains...'  'my mood is fine'.  Pt has had GI w/u, cards, pulmonary, rheum, ID, heme/onc.  Pt is unable to pinpoint her concern today but seems to keep circling back to fatigue and LE edema.   Review of Systems For ROS see HPI     Objective:   Physical Exam  Constitutional: She is oriented to person, place, and time. She appears well-developed and well-nourished. No distress.  HENT:  Head: Normocephalic and atraumatic.  Right Ear: Tympanic membrane normal.  Left Ear: Tympanic membrane normal.  Nose: Mucosal edema and rhinorrhea present. Right sinus exhibits no maxillary sinus tenderness and no frontal sinus tenderness. Left sinus exhibits no maxillary sinus tenderness and no frontal sinus tenderness.  Mouth/Throat: Mucous membranes are normal. Posterior oropharyngeal erythema (w/ PND) present.  Eyes: Conjunctivae and EOM are normal. Pupils are equal, round, and reactive to light.  Neck: Normal range of motion. Neck supple.  Cardiovascular: Normal rate, regular rhythm and normal heart sounds.   Pulmonary/Chest: Effort normal and breath sounds normal. No respiratory distress. She has no wheezes. She has no rales.  Lymphadenopathy:    She has no cervical adenopathy.  Neurological: She is alert and oriented to person, place, and time.  Skin: Skin is warm and dry.  Psychiatric: She has a normal mood and affect. Her behavior is normal.  Tangential thought process- skipping from 1 idea to another with no connection  Vitals reviewed.         Assessment & Plan:

## 2015-07-23 ENCOUNTER — Ambulatory Visit: Payer: Self-pay | Admitting: Family Medicine

## 2015-07-23 ENCOUNTER — Other Ambulatory Visit: Payer: Self-pay | Admitting: Family Medicine

## 2015-07-23 DIAGNOSIS — D72829 Elevated white blood cell count, unspecified: Secondary | ICD-10-CM

## 2015-07-23 LAB — CBC WITH DIFFERENTIAL/PLATELET
BASOS PCT: 0.4 % (ref 0.0–3.0)
Basophils Absolute: 0.1 10*3/uL (ref 0.0–0.1)
Eosinophils Absolute: 0.1 10*3/uL (ref 0.0–0.7)
Eosinophils Relative: 0.8 % (ref 0.0–5.0)
HEMATOCRIT: 34.6 % — AB (ref 36.0–46.0)
Hemoglobin: 11.1 g/dL — ABNORMAL LOW (ref 12.0–15.0)
LYMPHS ABS: 3.2 10*3/uL (ref 0.7–4.0)
Lymphocytes Relative: 25 % (ref 12.0–46.0)
MCHC: 32 g/dL (ref 30.0–36.0)
MCV: 86.2 fl (ref 78.0–100.0)
MONOS PCT: 6.9 % (ref 3.0–12.0)
Monocytes Absolute: 0.9 10*3/uL (ref 0.1–1.0)
NEUTROS ABS: 8.5 10*3/uL — AB (ref 1.4–7.7)
Neutrophils Relative %: 66.9 % (ref 43.0–77.0)
PLATELETS: 531 10*3/uL — AB (ref 150.0–400.0)
RBC: 4.02 Mil/uL (ref 3.87–5.11)
RDW: 12.5 % (ref 11.5–15.5)
WBC: 12.7 10*3/uL — ABNORMAL HIGH (ref 4.0–10.5)

## 2015-07-23 LAB — BASIC METABOLIC PANEL
BUN: 14 mg/dL (ref 6–23)
CALCIUM: 9.4 mg/dL (ref 8.4–10.5)
CO2: 28 meq/L (ref 19–32)
Chloride: 100 mEq/L (ref 96–112)
Creatinine, Ser: 0.9 mg/dL (ref 0.40–1.20)
GFR: 89.72 mL/min (ref >60.00)
Glucose, Bld: 67 mg/dL — ABNORMAL LOW (ref 70–99)
POTASSIUM: 3.5 meq/L (ref 3.5–5.1)
SODIUM: 137 meq/L (ref 135–145)

## 2015-07-23 LAB — HEPATIC FUNCTION PANEL
ALK PHOS: 107 U/L (ref 39–117)
ALT: 23 U/L (ref 0–35)
AST: 19 U/L (ref 0–37)
Albumin: 4 g/dL (ref 3.5–5.2)
BILIRUBIN DIRECT: 0.1 mg/dL (ref 0.0–0.3)
BILIRUBIN TOTAL: 0.4 mg/dL (ref 0.2–1.2)
Total Protein: 7.7 g/dL (ref 6.0–8.3)

## 2015-07-23 LAB — TSH: TSH: 2.29 u[IU]/mL (ref 0.35–4.50)

## 2015-08-18 ENCOUNTER — Ambulatory Visit (INDEPENDENT_AMBULATORY_CARE_PROVIDER_SITE_OTHER): Payer: 59 | Admitting: Family Medicine

## 2015-08-18 ENCOUNTER — Encounter: Payer: Self-pay | Admitting: Family Medicine

## 2015-08-18 VITALS — BP 130/78 | HR 81 | Temp 98.0°F | Resp 16 | Ht 64.0 in | Wt 226.2 lb

## 2015-08-18 DIAGNOSIS — R609 Edema, unspecified: Secondary | ICD-10-CM | POA: Diagnosis not present

## 2015-08-18 DIAGNOSIS — I10 Essential (primary) hypertension: Secondary | ICD-10-CM | POA: Diagnosis not present

## 2015-08-18 DIAGNOSIS — Z23 Encounter for immunization: Secondary | ICD-10-CM | POA: Diagnosis not present

## 2015-08-18 DIAGNOSIS — R0683 Snoring: Secondary | ICD-10-CM | POA: Diagnosis not present

## 2015-08-18 NOTE — Addendum Note (Signed)
Addended by: Davis Gourd on: 08/18/2015 08:57 AM   Modules accepted: Orders

## 2015-08-18 NOTE — Assessment & Plan Note (Signed)
Chronic problem.  Well controlled on current 5mg  Amlodipine and HCTZ 25mg .  No med changes at this time.  Stressed need for healthy diet and regular exercise.  Will follow.

## 2015-08-18 NOTE — Progress Notes (Signed)
Pre visit review using our clinic review tool, if applicable. No additional management support is needed unless otherwise documented below in the visit note. 

## 2015-08-18 NOTE — Patient Instructions (Signed)
Schedule your complete physical in 4-6 months We'll call you with your pulmonary appt for the sleep consultation Continue the Amlodipine 5mg  daily and the HCTZ 25mg  daily- BP and swelling look great! Continue to work on healthy diet and regular exercise as a stress outlet Call with any questions or concerns If you want to join [place] at the new [place] office, any scheduled appointments will automatically transfer and we will see you at [address] [place], [place], [place] [address]  Hang in there!  I think we're onto something!!

## 2015-08-18 NOTE — Progress Notes (Signed)
   Subjective:    Patient ID: Pamela Clarke, female    DOB: 1976/05/29, 39 y.o.   MRN: [id number]  HPI HTN- chronic problem.  At last visit, Amlodipine was decreased to 5mg  due to persistent leg swelling.  Swelling has decreased dramatically.  No longer having tightness of feet or ankles.  Has lost 6 lbs!  Also on HCTZ 25 mg daily.  Still having intermittent CP, SOB, fatigue despite multiple work ups.  Pt has hx of loud snoring.  Has never had a sleep study.   Review of Systems For ROS see HPI     Objective:   Physical Exam  Constitutional: She is oriented to person, place, and time. She appears well-developed and well-nourished. No distress.  HENT:  Head: Normocephalic and atraumatic.  Eyes: Conjunctivae and EOM are normal. Pupils are equal, round, and reactive to light.  Neck: Normal range of motion. Neck supple. No thyromegaly present.  Cardiovascular: Normal rate, regular rhythm, normal heart sounds and intact distal pulses.   No murmur heard. Pulmonary/Chest: Effort normal and breath sounds normal. No respiratory distress.  Abdominal: Soft. She exhibits no distension. There is no tenderness.  Musculoskeletal: She exhibits no edema.  Lymphadenopathy:    She has no cervical adenopathy.  Neurological: She is alert and oriented to person, place, and time.  Skin: Skin is warm and dry.  Psychiatric: She has a normal mood and affect. Her behavior is normal.  Vitals reviewed.         Assessment & Plan:

## 2015-08-18 NOTE — Assessment & Plan Note (Signed)
New.  Pt has hx of snoring and struggles w/ obesity.  She has had multiple workups for fatigue, SOB and it may all be related to sleep apnea.  Refer for complete evaluation and tx prn.  Pt expressed understanding and is in agreement w/ plan.

## 2015-08-18 NOTE — Assessment & Plan Note (Signed)
Improved since decreasing the Amlodipine to 5mg  daily.  Currently asymptomatic.  Has lost 6 lbs since last visit.  No need to check labs at this time.

## 2015-08-23 ENCOUNTER — Encounter: Payer: Self-pay | Admitting: Family Medicine

## 2015-08-24 ENCOUNTER — Encounter: Payer: Self-pay | Admitting: Family Medicine

## 2015-09-01 ENCOUNTER — Other Ambulatory Visit: Payer: Self-pay | Admitting: Cardiology

## 2015-09-07 ENCOUNTER — Other Ambulatory Visit: Payer: Self-pay | Admitting: Family Medicine

## 2015-09-07 NOTE — Telephone Encounter (Signed)
Medication filled to pharmacy as requested.   

## 2015-10-11 ENCOUNTER — Ambulatory Visit (INDEPENDENT_AMBULATORY_CARE_PROVIDER_SITE_OTHER): Payer: 59 | Admitting: Family Medicine

## 2015-10-11 ENCOUNTER — Encounter: Payer: Self-pay | Admitting: Family Medicine

## 2015-10-11 VITALS — BP 128/80 | HR 97 | Temp 98.1°F | Resp 16 | Ht 64.0 in | Wt 225.1 lb

## 2015-10-11 DIAGNOSIS — R140 Abdominal distension (gaseous): Secondary | ICD-10-CM

## 2015-10-11 DIAGNOSIS — R1310 Dysphagia, unspecified: Secondary | ICD-10-CM

## 2015-10-11 DIAGNOSIS — R110 Nausea: Secondary | ICD-10-CM | POA: Diagnosis not present

## 2015-10-11 MED ORDER — ONDANSETRON HCL 4 MG PO TABS: 4.0000 mg | ORAL_TABLET | Freq: Three times a day (TID) | ORAL | Status: DC | PRN

## 2015-10-11 NOTE — Assessment & Plan Note (Signed)
New to provider, ongoing for pt.  Currently on PPI and antihistamine w/o improvement in sxs.  Based on ongoing sxs, will refer to GI for complete evaluation and tx.  Pt expressed understanding and is in agreement w/ plan.

## 2015-10-11 NOTE — Progress Notes (Signed)
   Subjective:    Patient ID: Pamela Clarke, female    DOB: Feb 27, 1976, 39 y.o.   MRN: [id number]  HPI GI issues- 'i just don't feel well'.  + bloating, 'it's painful to lay on my stomach'.  Customers have asked whether she is pregnant.  + nausea.  'it feels like something's just stuck in my throat'- taking Prilosec and Zyrtec w/o improvement in swallowing issues.  Pt has noticed that BMs 'smell like blood' for the last few months.  Decreased appetite, increased fatigue despite increased sleeping.  'it's from my neck- through here (gesturing towards abdomen).  Pt reports she continues to have low grade temps, '99 point something'.  Pt saw GI in 2013 and was supposed to have a 3 month f/u but this did not take place.   Review of Systems For ROS see HPI     Objective:   Physical Exam  Constitutional: She is oriented to person, place, and time. She appears well-developed and well-nourished. No distress.  obese  HENT:  Head: Normocephalic and atraumatic.  Eyes: Conjunctivae and EOM are normal. Pupils are equal, round, and reactive to light.  Neck: Normal range of motion. Neck supple.  Cardiovascular: Normal rate, regular rhythm and normal heart sounds.   Pulmonary/Chest: Effort normal and breath sounds normal. No respiratory distress. She has no wheezes. She has no rales.  Abdominal: Soft. Bowel sounds are normal. She exhibits no distension. There is no tenderness. There is no rebound and no guarding.  Neurological: She is alert and oriented to person, place, and time.  Skin: Skin is warm and dry.  Psychiatric: Thought content normal.  Flat affect  Vitals reviewed.         Assessment & Plan:

## 2015-10-11 NOTE — Progress Notes (Signed)
Pre visit review using our clinic review tool, if applicable. No additional management support is needed unless otherwise documented below in the visit note. 

## 2015-10-11 NOTE — Assessment & Plan Note (Addendum)
New.  Pt reports intermittent, episodic nausea.  No diarrhea.  No obvious blood in BMs despite pt indicating the smell of blood.  Pt was supposed to have GI f/u back in 2013 but this did not happen.  Will refer back to GI.  Check labs.  zofran prn.  Reviewed supportive care and red flags that should prompt return.  Pt expressed understanding and is in agreement w/ plan.

## 2015-10-11 NOTE — Patient Instructions (Signed)
Schedule your complete physical at your convenience We'll notify you of your lab results and make any changes if needed We'll call you with your GI appt Continue the Omeprazole and Zyrtec daily Drink plenty of water Call with any questions or concern If you want to join [place] at the new [place] office, any scheduled appointments will automatically transfer and we will see you at [address] [place], [place], [place] [address]  Hang in there!!

## 2015-10-11 NOTE — Assessment & Plan Note (Signed)
New.  Pt reports increased abdominal pain/bloating associated w/ nausea but no vomiting.  Pt states she continues to have low grade temps (an ongoing issue for her- has been extensively worked up by [employer], ID).  Check labs to r/o biliary abnormality, H pylori infxn, or other cause.  Continue PPI.  Refer to GI.  Reviewed supportive care and red flags that should prompt return.  Pt expressed understanding and is in agreement w/ plan.

## 2015-10-12 LAB — LIPASE: Lipase: 17 U/L (ref 11.0–59.0)

## 2015-10-12 LAB — CBC WITH DIFFERENTIAL/PLATELET
BASOS PCT: 0.3 % (ref 0.0–3.0)
Basophils Absolute: 0 10*3/uL (ref 0.0–0.1)
EOS PCT: 0.7 % (ref 0.0–5.0)
Eosinophils Absolute: 0.1 10*3/uL (ref 0.0–0.7)
HEMATOCRIT: 37.3 % (ref 36.0–46.0)
HEMOGLOBIN: 11.7 g/dL — AB (ref 12.0–15.0)
LYMPHS PCT: 22.8 % (ref 12.0–46.0)
Lymphs Abs: 2.9 10*3/uL (ref 0.7–4.0)
MCHC: 31.5 g/dL (ref 30.0–36.0)
MCV: 86.8 fl (ref 78.0–100.0)
MONOS PCT: 7.1 % (ref 3.0–12.0)
Monocytes Absolute: 0.9 10*3/uL (ref 0.1–1.0)
Neutro Abs: 8.7 10*3/uL — ABNORMAL HIGH (ref 1.4–7.7)
Neutrophils Relative %: 69.1 % (ref 43.0–77.0)
Platelets: 520 10*3/uL — ABNORMAL HIGH (ref 150.0–400.0)
RBC: 4.29 Mil/uL (ref 3.87–5.11)
RDW: 12.7 % (ref 11.5–15.5)
WBC: 12.6 10*3/uL — AB (ref 4.0–10.5)

## 2015-10-12 LAB — HEPATIC FUNCTION PANEL
ALBUMIN: 4 g/dL (ref 3.5–5.2)
ALT: 31 U/L (ref 0–35)
AST: 31 U/L (ref 0–37)
Alkaline Phosphatase: 101 U/L (ref 39–117)
BILIRUBIN TOTAL: 0.4 mg/dL (ref 0.2–1.2)
Bilirubin, Direct: 0.1 mg/dL (ref 0.0–0.3)
Total Protein: 7.8 g/dL (ref 6.0–8.3)

## 2015-10-12 LAB — BASIC METABOLIC PANEL
BUN: 11 mg/dL (ref 6–23)
CALCIUM: 9.7 mg/dL (ref 8.4–10.5)
CO2: 29 mEq/L (ref 19–32)
Chloride: 99 mEq/L (ref 96–112)
Creatinine, Ser: 1 mg/dL (ref 0.40–1.20)
GFR: 79.36 mL/min (ref >60.00)
GLUCOSE: 87 mg/dL (ref 70–99)
Potassium: 3.4 mEq/L — ABNORMAL LOW (ref 3.5–5.1)
Sodium: 137 mEq/L (ref 135–145)

## 2015-10-12 LAB — AMYLASE: AMYLASE: 65 U/L (ref 27–131)

## 2015-10-12 LAB — H. PYLORI ANTIBODY, IGG: H PYLORI IGG: NEGATIVE

## 2015-10-15 ENCOUNTER — Ambulatory Visit: Payer: 59 | Admitting: Gastroenterology

## 2015-12-20 ENCOUNTER — Encounter: Payer: Self-pay | Admitting: Internal Medicine

## 2015-12-20 ENCOUNTER — Ambulatory Visit (INDEPENDENT_AMBULATORY_CARE_PROVIDER_SITE_OTHER): Payer: 59 | Admitting: Internal Medicine

## 2015-12-20 VITALS — BP 120/74 | HR 81 | Ht 65.0 in | Wt 227.0 lb

## 2015-12-20 DIAGNOSIS — G4733 Obstructive sleep apnea (adult) (pediatric): Secondary | ICD-10-CM | POA: Diagnosis not present

## 2015-12-20 NOTE — Patient Instructions (Addendum)
Order- schedule unattended home sleep test      Dx OSA  Please call to make contact if you haven't heard from [place] by a week of so after the study is done.

## 2015-12-20 NOTE — Assessment & Plan Note (Signed)
Anxiety component may be important but we need to rule out obstructive sleep apnea given reported history of loud snoring and persistent daytime fatigue with compatible anatomy on exam Plan-home sleep test

## 2015-12-20 NOTE — Progress Notes (Signed)
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 No sleep study. Dr Birdie Riddle got hx loud snoring, fatigue Bedtime between 10 PM and 1 AM, estimating sleep latency 2 minutes and waking once before up between 7 and 9:30 AM. 30 pound weight gain in the last 2 years. Occasional caffeine as soft drinks. No sleeping pills. ENT surgery-none. Some seasonal allergic rhinitis mainly in the fall. Thyroid remotely tested normal. Lives alone.  Past Medical History  Diagnosis Date  . Arthritis   . Blood in stool   . Heart murmur   . Obesity   . Hypertension     history of this, no meds  . Fatty liver   . Hyperplastic colon polyp 10/22/12  . Anxiety   . Thrombocytosis ([place]) 06/02/2014  . Unspecified deficiency anemia 06/02/2014  . Hypokalemia 06/02/2014  . Depression   . Lung nodule 09/16/2014  . Axillary lymphadenopathy 05/18/2015   Past Surgical History  Procedure Laterality Date  . Knee surgery  3 23 13     right  . Knee arthroscopy      left  . Breast reduction surgery  10/19/08   Family History  Problem Relation Age of Onset  . Stroke Father   . Hypertension Father   . Diabetes Father   . Hypertension Maternal Grandmother   . Stroke Maternal Grandmother   . Heart disease Maternal Grandmother   . Ovarian cancer Paternal Grandmother   . Colon cancer Neg Hx   . Heart disease Paternal Grandfather   . Stroke Paternal Grandfather   . Sickle cell anemia Cousin   . Stomach cancer Cousin     1st cousin     Social History   Social History  . Marital Status: Single    Spouse Name: N/A  . Number of Children: 0  . Years of Education: N/A   Occupational History  . pharmacy tech    Social History Main Topics  . Smoking status: Never Smoker   . Smokeless tobacco: Never Used  . Alcohol Use: 0.0 oz/week    0 Standard drinks or equivalent per week      Comment: occasionally wine 1-2 glasses q2 weeks  . Drug Use: No  . Sexual Activity: Yes    Birth Control/ Protection: None   Other Topics Concern  . Not on file   Social History Narrative   Lives w/ female partner   ROS-see HPI   Negative unless "+" Constitutional:    weight loss, night sweats, fevers, chills, fatigue, lassitude. HEENT:    headaches, + difficulty swallowing, tooth/dental problems, sore throat,       sneezing, + itching, ear ache, nasal congestion, post nasal drip, snoring CV:    chest pain, orthopnea, PND, swelling in lower extremities, anasarca,                                                     dizziness, palpitations Resp:   + shortness of breath with exertion or at rest.               + productive cough,   non-productive cough, coughing up of blood.              change in color of mucus.  wheezing.   Skin:    rash or lesions. GI:  No-   heartburn, indigestion, abdominal pain, nausea, vomiting, diarrhea,                 change in bowel habits, loss of appetite GU: dysuria, change in color of urine, no urgency or frequency.   flank pain. MS:   + joint pain, stiffness, decreased range of motion, back pain. Neuro-     nothing unusual Psych:  change in mood or affect.  depression or anxiety.   memory loss.  OBJ- Physical Exam General- Alert, Oriented, Affect-appropriate, Distress- none acute, + overweight Skin- rash-none, lesions- none, excoriation- none Lymphadenopathy- none Head- atraumatic            Eyes- Gross vision intact, PERRLA, conjunctivae and secretions clear            Ears- Hearing, + cerumen            Nose- + turbinate edema, no-Septal dev, mucus, polyps, erosion, perforation             Throat- Mallampati III-IV , mucosa clear , drainage- none, tonsils- atrophic Neck- flexible , trachea midline, no stridor , thyroid nl, carotid no bruit Chest - symmetrical excursion , unlabored           Heart/CV- RRR , no murmur , no gallop  , no rub, nl s1  s2                           - JVD- none , edema- none, stasis changes- none, varices- none           Lung- clear to P&A, wheeze- none, cough- none , dullness-none, rub- none           Chest wall-  Abd-  Br/ Gen/ Rectal- Not done, not indicated Extrem- cyanosis- none, clubbing, none, atrophy- none, strength- nl Neuro- grossly intact to observation

## 2015-12-21 ENCOUNTER — Ambulatory Visit (INDEPENDENT_AMBULATORY_CARE_PROVIDER_SITE_OTHER): Payer: 59 | Admitting: Gastroenterology

## 2015-12-21 ENCOUNTER — Other Ambulatory Visit (INDEPENDENT_AMBULATORY_CARE_PROVIDER_SITE_OTHER): Payer: 59

## 2015-12-21 ENCOUNTER — Encounter: Payer: Self-pay | Admitting: Gastroenterology

## 2015-12-21 VITALS — BP 132/84 | HR 76 | Ht 63.5 in | Wt 224.1 lb

## 2015-12-21 DIAGNOSIS — R6881 Early satiety: Secondary | ICD-10-CM | POA: Diagnosis not present

## 2015-12-21 DIAGNOSIS — R140 Abdominal distension (gaseous): Secondary | ICD-10-CM

## 2015-12-21 DIAGNOSIS — D509 Iron deficiency anemia, unspecified: Secondary | ICD-10-CM

## 2015-12-21 DIAGNOSIS — K760 Fatty (change of) liver, not elsewhere classified: Secondary | ICD-10-CM

## 2015-12-21 LAB — CBC WITH DIFFERENTIAL/PLATELET
BASOS PCT: 0.5 % (ref 0.0–3.0)
Basophils Absolute: 0.1 10*3/uL (ref 0.0–0.1)
EOS PCT: 0.6 % (ref 0.0–5.0)
Eosinophils Absolute: 0.1 10*3/uL (ref 0.0–0.7)
HCT: 38.9 % (ref 36.0–46.0)
Hemoglobin: 12.5 g/dL (ref 12.0–15.0)
Lymphocytes Relative: 14.1 % (ref 12.0–46.0)
Lymphs Abs: 1.8 10*3/uL (ref 0.7–4.0)
MCHC: 32.1 g/dL (ref 30.0–36.0)
MCV: 85.4 fl (ref 78.0–100.0)
MONO ABS: 0.7 10*3/uL (ref 0.1–1.0)
Monocytes Relative: 5.1 % (ref 3.0–12.0)
NEUTROS PCT: 79.7 % — AB (ref 43.0–77.0)
Neutro Abs: 10.4 10*3/uL — ABNORMAL HIGH (ref 1.4–7.7)
PLATELETS: 540 10*3/uL — AB (ref 150.0–400.0)
RBC: 4.55 Mil/uL (ref 3.87–5.11)
RDW: 12.8 % (ref 11.5–15.5)
WBC: 13 10*3/uL — AB (ref 4.0–10.5)

## 2015-12-21 LAB — IBC PANEL
IRON: 74 ug/dL (ref 42–145)
Saturation Ratios: 17.6 % — ABNORMAL LOW (ref 20.0–50.0)
Transferrin: 300 mg/dL (ref 212.0–360.0)

## 2015-12-21 LAB — IGA: IGA: 245 mg/dL (ref 68–378)

## 2015-12-21 LAB — FERRITIN: Ferritin: 107.6 ng/mL (ref 10.0–291.0)

## 2015-12-21 NOTE — Patient Instructions (Signed)
You have been scheduled for an endoscopy. Please follow written instructions given to you at your visit today. If you use inhalers (even only as needed), please bring them with you on the day of your procedure. Your physician has requested that you go to www.startemmi.com and enter the access code given to you at your visit today. This web site gives a general overview about your procedure. However, you should still follow specific instructions given to you by our office regarding your preparation for the procedure.  We have sent the following medications to your pharmacy for you to pick up at your convenience: Bentyl   Please start Low FODMAP diet.   Your physician has requested that you go to the basement for lab work before leaving today.

## 2015-12-21 NOTE — Progress Notes (Signed)
HPI :  40 y/o female, former patient of Dr. Olevia Perches, new to me, here for reassessment.   Patient reports she previously had a prior colonoscopy by Dr. Olevia Perches for blood in the stools, and told she had an anal fissure and was treated for this as the cause of her bleeding which has since improved.  She is here today regarding some upper tract symptoms that have been bothering her recently. She feels full easily and feels quite bloated most of the time. She reports she has eaten smaller volumes due to these symptoms. She thinks her symptoms have been ongoing for 8-10 months. She has nausea prior to eating, but does not vomit routinely, but can happen sporadically. She feels significant distension of her abdomen and some pain in her epigastric area after she eats. She has a metallic taste in her mouth which can come and go which has also been ongoing on for several month.  She denies any heartburn or regurgitation. She denies any dysphagia or odynophagia. She does endorse a sense of globus present constantly. She thinks her weight has fluctuated, but no progressive weight loss.  She has tried some zofran for nausea which can provide some benefit.  She has also tried some omeprazole and did not think it helped, but does not take it routinely. She takes periodic NSAIDs - she thinks she maybe takes them twice per week.   She thinks her stools have been dark. She has anywhere from 2-4 BMs per day. She reports she has alternating stool forms, sometime loose, sometimes normal form. She reports she feels like it's unsettled stomach frequently. She does not notice much significant relief with a bowel movement.  She has rare blood in the stool she thinks due to history of fissure, nothing like it was previously. She takes an iron supplement but not daily. She has been on this for a long time. She is on OCP which she states she does not have menses, her last was 11 months ago.   She otherwise reports she has had a  history of elevated liver enzymes, thought to be related to fatty liver, and had questions about this. Her most recent liver enzymes have been normal.   Colonoscopy 10/2012 - hyperplastic polyp   Past Medical History  Diagnosis Date  . Arthritis   . Blood in stool   . Heart murmur   . Obesity   . Hypertension     history of this, no meds  . Fatty liver   . Hyperplastic colon polyp 10/22/12  . Anxiety   . Thrombocytosis ([place]) 06/02/2014  . Unspecified deficiency anemia 06/02/2014  . Hypokalemia 06/02/2014  . Depression   . Lung nodule 09/16/2014  . Axillary lymphadenopathy 05/18/2015     Past Surgical History  Procedure Laterality Date  . Knee surgery  3 23 13     right  . Knee arthroscopy      left  . Breast reduction surgery  10/19/08   Family History  Problem Relation Age of Onset  . Stroke Father   . Hypertension Father   . Diabetes Father   . Hypertension Maternal Grandmother   . Stroke Maternal Grandmother   . Heart disease Maternal Grandmother   . Ovarian cancer Paternal Grandmother   . Colon cancer Neg Hx   . Heart disease Paternal Grandfather   . Stroke Paternal Grandfather   . Sickle cell anemia Cousin   . Stomach cancer Cousin     1st cousin  Social History  Substance Use Topics  . Smoking status: Never Smoker   . Smokeless tobacco: Never Used  . Alcohol Use: 0.0 oz/week    0 Standard drinks or equivalent per week     Comment: occasionally wine 1-2 glasses q2 weeks   Current Outpatient Prescriptions  Medication Sig Dispense Refill  . amLODipine (NORVASC) 5 MG tablet Take 1 tablet (5 mg total) by mouth daily. 90 tablet 3  . cetirizine (ZYRTEC) 10 MG tablet Take 1 tablet (10 mg total) by mouth daily. 30 tablet 11  . ferrous sulfate 325 (65 FE) MG tablet Take 325 mg by mouth daily with breakfast.    . FLUoxetine (PROZAC) 20 MG tablet TAKE 1 TABLET (20 MG TOTAL) BY MOUTH DAILY. 90 tablet 0  . hydrochlorothiazide (HYDRODIURIL) 25 MG tablet TAKE 1 TABLET  (25 MG TOTAL) BY MOUTH DAILY. 90 tablet 1  . ondansetron (ZOFRAN) 4 MG tablet Take 1 tablet (4 mg total) by mouth every 8 (eight) hours as needed for nausea or vomiting. 20 tablet 0  . SPRINTEC 28 0.25-35 MG-MCG tablet TAKE 1 TABLET BY MOUTH DAILY. SKIP PLACEBO PILLS AND GO RIGHT INTO NEXT PACK 112 tablet 3  . vitamin B-12 (CYANOCOBALAMIN) 500 MCG tablet Take 500 mcg by mouth daily. 1 every 3 days     No current facility-administered medications for this visit.   No Known Allergies   Review of Systems: All systems reviewed and negative except where noted in HPI.   Lab Results  Component Value Date   WBC 13.0* 12/21/2015   HGB 12.5 12/21/2015   HCT 38.9 12/21/2015   MCV 85.4 12/21/2015   PLT 540.0* 12/21/2015   Lab Results  Component Value Date   ALT 31 10/11/2015   AST 31 10/11/2015   ALKPHOS 101 10/11/2015   BILITOT 0.4 10/11/2015    Lab Results  Component Value Date   CREATININE 1.00 10/11/2015   BUN 11 10/11/2015   NA 137 10/11/2015   K 3.4* 10/11/2015   CL 99 10/11/2015   CO2 29 10/11/2015    Lab Results  Component Value Date   IRON 74 12/21/2015   TIBC 325 05/18/2015   FERRITIN 107.6 12/21/2015    Physical Exam: Ht 5' 3.5" (1.613 m)  Wt 224 lb 2 oz (101.662 kg)  BMI 39.07 kg/m2 Constitutional: Pleasant,well-developed, female in no acute distress. HEENT: Normocephalic and atraumatic. Conjunctivae are normal. No scleral icterus. Neck supple.  Cardiovascular: Normal rate, regular rhythm.  Pulmonary/chest: Effort normal and breath sounds normal. No wheezing, rales or rhonchi. Abdominal: Soft, nondistended, mild epigastric TTP without rebound or guarding. Bowel sounds active throughout. There are no masses palpable. No hepatomegaly. Extremities: no edema Lymphadenopathy: No cervical adenopathy noted. Neurological: Alert and oriented to person place and time. Skin: Skin is warm and dry. No rashes noted. Psychiatric: Normal mood and affect. Behavior is  normal.   ASSESSMENT AND PLAN: 40 y/o female presenting with multiple issues as outline below:  Early satiety / abdominal bloating - I checked her CBC, CMP, and celiac serologies to ensure normal. CMP is normal. CBC shows no anemia, but chronic thrombocytosis with mild chronic leukocytosis. She is followed by hematology for these chronic findings and should follow up with them for the hematologic abnormalities. Given chronicity of symptoms otherwise I offered her an EGD to ensure normal, rule out H pylori or mucosal process. I also counseled her on a low FODMOP diet regarding her bloating to see if this helps her symptoms. I  asked her to take omeprazole as prescribed, daily, for at least a few weeks to see if she notes improved given she has taken this only sporadically and not routinely. Bentyl has provided some relief of her bloating and she can continue this.  I will await results of her labs and let her know the results. The indications, risks, and benefits of EGD were explained to the patient in detail. Risks include but are not limited to bleeding, perforation, adverse reaction to medications, and cardiopulmonary compromise. Sequelae include but are not limited to the possibility of surgery, hospitalization, and mortality. The patient verbalized understanding and wished to proceed. All questions answered, referred to scheduler. Further recommendations pending results of the exam.   History of iron deficiency - she is on oral iron intermittently and has not had a menstrual cycle since being on new OCP for several months. Will recheck iron stores to see if normalized on this regimen, if not and it persists despite not menstruating, may consider further endoscopic evaluation pending results of EGD.   Fatty liver - counseled patient on fatty liver, noted on [place] a few years ago. Her liver enzymes have since normalized. Recommend she maintain goal BMI and have LFTs monitored at least yearly to ensure  stable. I recommend she avoid drinking alcohol and counseled her on the long term risks of NASH/cirrhosis. Routine coffee drinking can help prevent fibrotic changes. She should follow up with GI yearly for this issue.   [place] Cellar, MD [facility] Pager [phone]

## 2015-12-22 ENCOUNTER — Encounter: Payer: Self-pay | Admitting: Gastroenterology

## 2015-12-22 LAB — TISSUE TRANSGLUTAMINASE, IGA: TISSUE TRANSGLUTAMINASE AB, IGA: 1 U/mL (ref <4)

## 2015-12-22 MED ORDER — DICYCLOMINE HCL 20 MG PO TABS: 20.0000 mg | ORAL_TABLET | Freq: Three times a day (TID) | ORAL | Status: DC | PRN

## 2015-12-29 ENCOUNTER — Telehealth: Payer: Self-pay | Admitting: *Deleted

## 2015-12-29 NOTE — Telephone Encounter (Signed)
Pt at work at time of pre-visit call and unable to participate at this time. Pt stated she would call back.

## 2015-12-29 NOTE — Telephone Encounter (Signed)
Pt called back [facility] # [phone]

## 2015-12-30 ENCOUNTER — Ambulatory Visit (INDEPENDENT_AMBULATORY_CARE_PROVIDER_SITE_OTHER): Payer: 59 | Admitting: Family Medicine

## 2015-12-30 ENCOUNTER — Encounter: Payer: Self-pay | Admitting: Family Medicine

## 2015-12-30 VITALS — BP 134/78 | HR 74 | Temp 98.2°F | Ht 64.0 in | Wt 227.0 lb

## 2015-12-30 DIAGNOSIS — Z0000 Encounter for general adult medical examination without abnormal findings: Secondary | ICD-10-CM | POA: Diagnosis not present

## 2015-12-30 NOTE — Progress Notes (Signed)
Pre visit review using our clinic review tool, if applicable. No additional management support is needed unless otherwise documented below in the visit note. 

## 2015-12-30 NOTE — Progress Notes (Signed)
   Subjective:    Patient ID: Pamela Clarke, female    DOB: 1976-08-20, 40 y.o.   MRN: [id number]  HPI CPE- UTD on mammo, pap (Dr Valentino Saxon)   Review of Systems Patient reports no vision/ hearing changes, adenopathy,fever, weight change,  persistant/recurrent hoarseness , swallowing issues, palpitations, edema, persistant/recurrent cough, hemoptysis, dyspnea (rest/exertional/paroxysmal nocturnal), significant heartburn, bowel changes, GU symptoms (dysuria, hematuria, incontinence), Gyn symptoms (abnormal  bleeding, pain),  syncope, focal weakness, memory loss, numbness & tingling, skin/hair/nail changes, abnormal bruising or bleeding, anxiety, or depression.   + intermittent CP- normal stress test Ongoing abd pain- seeing Dr Havery Moros + blood in stool    Objective:   Physical Exam General Appearance:    Alert, cooperative, no distress, appears stated age, obese  Head:    Normocephalic, without obvious abnormality, atraumatic  Eyes:    PERRL, conjunctiva/corneas clear, EOM's intact, fundi    benign, both eyes  Ears:    Normal TM's and external ear canals, both ears  Nose:   Nares normal, septum midline, mucosa normal, no drainage    or sinus tenderness  Throat:   Lips, mucosa, and tongue normal; teeth and gums normal  Neck:   Supple, symmetrical, trachea midline, no adenopathy;    Thyroid: no enlargement/tenderness/nodules  Back:     Symmetric, no curvature, ROM normal, no CVA tenderness  Lungs:     Clear to auscultation bilaterally, respirations unlabored  Chest Wall:    No tenderness or deformity   Heart:    Regular rate and rhythm, S1 and S2 normal, no murmur, rub   or gallop  Breast Exam:    Deferred to GYN  Abdomen:     Soft, non-tender, bowel sounds active all four quadrants,    no masses, no organomegaly  Genitalia:    Deferred to GYN  Rectal:    Extremities:   Extremities normal, atraumatic, no cyanosis or edema  Pulses:   2+ and symmetric all extremities  Skin:   Skin  color, texture, turgor normal, no rashes or lesions  Lymph nodes:   Cervical, supraclavicular, and axillary nodes normal  Neurologic:   CNII-XII intact, normal strength, sensation and reflexes    throughout          Assessment & Plan:

## 2015-12-30 NOTE — Patient Instructions (Signed)
Follow up in 6 months to recheck BP We'll notify you of your lab results and make any changes if needed Continue to work on healthy diet and regular exercise- you can do it! Call with any questions or concerns If you want to join [place] at the new [place] office, any scheduled appointments will automatically transfer and we will see you at [address] [place] [address], [place], [place] [address] ([place]) [place]!!!

## 2015-12-30 NOTE — Assessment & Plan Note (Signed)
Pt's PE WNL w/ exception of obesity.  UTD on pap, mammo.  Check labs.  Anticipatory guidance provided.  

## 2015-12-31 LAB — CBC WITH DIFFERENTIAL/PLATELET
Basophils Absolute: 0 10*3/uL (ref 0.0–0.1)
Basophils Relative: 0.3 % (ref 0.0–3.0)
EOS PCT: 0.7 % (ref 0.0–5.0)
Eosinophils Absolute: 0.1 10*3/uL (ref 0.0–0.7)
HCT: 36.2 % (ref 36.0–46.0)
HEMOGLOBIN: 11.5 g/dL — AB (ref 12.0–15.0)
Lymphocytes Relative: 21.5 % (ref 12.0–46.0)
Lymphs Abs: 3 10*3/uL (ref 0.7–4.0)
MCHC: 31.8 g/dL (ref 30.0–36.0)
MCV: 86.6 fl (ref 78.0–100.0)
MONO ABS: 0.6 10*3/uL (ref 0.1–1.0)
MONOS PCT: 4.2 % (ref 3.0–12.0)
Neutro Abs: 10.1 10*3/uL — ABNORMAL HIGH (ref 1.4–7.7)
Neutrophils Relative %: 73.3 % (ref 43.0–77.0)
Platelets: 494 10*3/uL — ABNORMAL HIGH (ref 150.0–400.0)
RBC: 4.18 Mil/uL (ref 3.87–5.11)
RDW: 13.1 % (ref 11.5–15.5)
WBC: 13.7 10*3/uL — ABNORMAL HIGH (ref 4.0–10.5)

## 2015-12-31 LAB — BASIC METABOLIC PANEL
BUN: 11 mg/dL (ref 6–23)
CHLORIDE: 100 meq/L (ref 96–112)
CO2: 30 mEq/L (ref 19–32)
Calcium: 9 mg/dL (ref 8.4–10.5)
Creatinine, Ser: 1.06 mg/dL (ref 0.40–1.20)
GFR: 74.11 mL/min (ref >60.00)
Glucose, Bld: 71 mg/dL (ref 70–99)
POTASSIUM: 3.2 meq/L — AB (ref 3.5–5.1)
SODIUM: 138 meq/L (ref 135–145)

## 2015-12-31 LAB — LIPID PANEL
CHOL/HDL RATIO: 2
Cholesterol: 109 mg/dL (ref 0–200)
HDL: 53.1 mg/dL (ref >39.00)
LDL CALC: 39 mg/dL (ref 0–99)
NONHDL: 55.99
Triglycerides: 83 mg/dL (ref 0.0–149.0)
VLDL: 16.6 mg/dL (ref 0.0–40.0)

## 2015-12-31 LAB — HEPATIC FUNCTION PANEL
ALBUMIN: 3.7 g/dL (ref 3.5–5.2)
ALK PHOS: 114 U/L (ref 39–117)
ALT: 45 U/L — ABNORMAL HIGH (ref 0–35)
AST: 34 U/L (ref 0–37)
BILIRUBIN DIRECT: 0.1 mg/dL (ref 0.0–0.3)
BILIRUBIN TOTAL: 0.5 mg/dL (ref 0.2–1.2)
Total Protein: 7.2 g/dL (ref 6.0–8.3)

## 2015-12-31 LAB — VITAMIN D 25 HYDROXY (VIT D DEFICIENCY, FRACTURES): VITD: 31.39 ng/mL (ref 30.00–100.00)

## 2015-12-31 LAB — TSH: TSH: 1.01 u[IU]/mL (ref 0.35–4.50)

## 2016-01-03 ENCOUNTER — Other Ambulatory Visit: Payer: Self-pay | Admitting: General Practice

## 2016-01-03 MED ORDER — POTASSIUM CHLORIDE CRYS ER 20 MEQ PO TBCR: 20.0000 meq | EXTENDED_RELEASE_TABLET | Freq: Every day | ORAL | Status: DC

## 2016-01-04 ENCOUNTER — Other Ambulatory Visit: Payer: Self-pay | Admitting: Family Medicine

## 2016-01-04 NOTE — Telephone Encounter (Signed)
Dr. Birdie Riddle, please advise. Pt last seen 12/31/15 for CPE. Zofran originally rx'd 10/2015 at [place] for nausea w/o vomiting.

## 2016-01-06 DIAGNOSIS — G4733 Obstructive sleep apnea (adult) (pediatric): Secondary | ICD-10-CM | POA: Diagnosis not present

## 2016-01-11 ENCOUNTER — Other Ambulatory Visit: Payer: Self-pay | Admitting: *Deleted

## 2016-01-11 DIAGNOSIS — G4733 Obstructive sleep apnea (adult) (pediatric): Secondary | ICD-10-CM

## 2016-01-12 ENCOUNTER — Encounter: Payer: Self-pay | Admitting: Gastroenterology

## 2016-01-12 ENCOUNTER — Ambulatory Visit (AMBULATORY_SURGERY_CENTER): Payer: 59 | Admitting: Gastroenterology

## 2016-01-12 VITALS — BP 117/74 | HR 68 | Temp 98.6°F | Resp 19 | Ht 63.0 in | Wt 224.0 lb

## 2016-01-12 DIAGNOSIS — R6881 Early satiety: Secondary | ICD-10-CM

## 2016-01-12 DIAGNOSIS — R140 Abdominal distension (gaseous): Secondary | ICD-10-CM

## 2016-01-12 MED ORDER — SODIUM CHLORIDE 0.9 % IV SOLN: 500.0000 mL | INTRAVENOUS | Status: DC

## 2016-01-12 NOTE — Op Note (Signed)
[place]  [address]. [place], [address]   ENDOSCOPY PROCEDURE REPORT  PATIENT: Pamela Clarke, Pamela Clarke  MR#: [id number] BIRTHDATE: December 03, 1976 , 39  yrs. old GENDER: female ENDOSCOPIST: Yetta Flock, MD REFERRED BY: PROCEDURE DATE:  01/12/2016 PROCEDURE:  EGD w/ biopsy ASA CLASS:     Class II INDICATIONS:  bloating, early satiety, history of iron deficiency. MEDICATIONS: Propofol 200 mg IV TOPICAL ANESTHETIC:  DESCRIPTION OF PROCEDURE: After the risks benefits and alternatives of the procedure were thoroughly explained, informed consent was obtained.  The LB [id number] endoscope was introduced through the mouth and advanced to the second portion of the duodenum , Without limitations.  The instrument was slowly withdrawn as the mucosa was fully examined.   FINDINGS: The esophagus was normal in appearance.  DH, GEJ, and SCJ were located 35cm from the incisors.  The stomach was normal without ulcerations, erosions, or inflammatory changes.  Biopsies were taken from the antrum and body to rule out H pylori.  The duodenal bulb and 2nd portion of the duodenum were normal. Biopsies taken to rule out celiac disease given the patient's symptoms.  Retroflexed views revealed no abnormalities.     The scope was then withdrawn from the patient and the procedure completed.  COMPLICATIONS: There were no immediate complications.  ENDOSCOPIC IMPRESSION: Normal endoscopy - gastric and duodenal biopsies taken as outlined above  RECOMMENDATIONS: Await pathology results Resume diet Resume medications    eSigned:  Yetta Flock, MD 01/12/2016 4:37 PM    CC: the patient

## 2016-01-12 NOTE — Progress Notes (Signed)
Called to room to assist during endoscopic procedure.  Patient ID and intended procedure confirmed with present staff. Received instructions for my participation in the procedure from the performing physician.  

## 2016-01-12 NOTE — Progress Notes (Signed)
Report to PACU, RN, vss, BBS= Clear.  

## 2016-01-12 NOTE — Patient Instructions (Signed)
YOU HAD AN ENDOSCOPIC PROCEDURE TODAY AT [facility]:   Refer to the procedure report that was given to you for any specific questions about what was found during the examination.  If the procedure report does not answer your questions, please call your gastroenterologist to clarify.  If you requested that your care partner not be given the details of your procedure findings, then the procedure report has been included in a sealed envelope for you to review at your convenience later.  YOU SHOULD EXPECT: Some feelings of bloating in the abdomen. Passage of more gas than usual.  Walking can help get rid of the air that was put into your GI tract during the procedure and reduce the bloating.  Please Note:  You might notice some irritation and congestion in your nose or some drainage.  This is from the oxygen used during your procedure.  There is no need for concern and it should clear up in a day or so.  SYMPTOMS TO REPORT IMMEDIATELY:  Following upper endoscopy (EGD)  Vomiting of blood or coffee ground material  New chest pain or pain under the shoulder blades  Painful or persistently difficult swallowing  New shortness of breath  Fever of 100F or higher  Black, tarry-looking stools  For urgent or emergent issues, a gastroenterologist can be reached at any hour by calling [phone].   DIET: Your first meal following the procedure should be a small meal and then it is ok to progress to your normal diet. Heavy or fried foods are harder to digest and may make you feel nauseous or bloated.  Likewise, meals heavy in dairy and vegetables can increase bloating.  Drink plenty of fluids but you should avoid alcoholic beverages for 24 hours.  ACTIVITY:  You should plan to take it easy for the rest of today and you should NOT DRIVE or use heavy machinery until tomorrow (because of the sedation medicines used during the test).    FOLLOW UP: Our staff will call the number listed on  your records the next business day following your procedure to check on you and address any questions or concerns that you may have regarding the information given to you following your procedure. If we do not reach you, we will leave a message.  However, if you are feeling well and you are not experiencing any problems, there is no need to return our call.  We will assume that you have returned to your regular daily activities without incident.  If any biopsies were taken you will be contacted by phone or by letter within the next 1-3 weeks.  Please call us at [phone] if you have not heard about the biopsies in 3 weeks.   SIGNATURES/CONFIDENTIALITY: You and/or your care partner have signed paperwork which will be entered into your electronic medical record.  These signatures attest to the fact that that the information above on your After Visit Summary has been reviewed and is understood.  Full responsibility of the confidentiality of this discharge information lies with you and/or your care-partner.  Normal procedure- await pathology  Continue your normal medications and diet

## 2016-01-13 ENCOUNTER — Telehealth: Payer: Self-pay | Admitting: Emergency Medicine

## 2016-01-13 NOTE — Telephone Encounter (Signed)
  Follow up Call-  Call back number 01/12/2016  Post procedure Call Back phone  # [phone]  Permission to leave phone message Yes     Patient questions:  Do you have a fever, pain , or abdominal swelling? No. Pain Score  0 *  Have you tolerated food without any problems? Yes.    Have you been able to return to your normal activities? Yes.    Do you have any questions about your discharge instructions: Diet   No. Medications  No. Follow up visit  No.  Do you have questions or concerns about your Care? No.  Actions: * If pain score is 4 or above: No action needed, pain <4.

## 2016-01-14 ENCOUNTER — Encounter: Payer: Self-pay | Admitting: Family Medicine

## 2016-01-14 MED ORDER — POTASSIUM CHLORIDE 20 MEQ PO PACK: 20.0000 meq | PACK | Freq: Once | ORAL | Status: DC

## 2016-01-19 ENCOUNTER — Encounter: Payer: Self-pay | Admitting: Gastroenterology

## 2016-01-19 ENCOUNTER — Other Ambulatory Visit: Payer: Self-pay | Admitting: *Deleted

## 2016-01-19 DIAGNOSIS — A048 Other specified bacterial intestinal infections: Secondary | ICD-10-CM

## 2016-01-19 MED ORDER — BIS SUBCIT-METRONID-TETRACYC 140-125-125 MG PO CAPS: 3.0000 | ORAL_CAPSULE | Freq: Three times a day (TID) | ORAL | Status: DC

## 2016-01-19 MED ORDER — OMEPRAZOLE 20 MG PO CPDR: DELAYED_RELEASE_CAPSULE | ORAL | Status: DC

## 2016-01-20 ENCOUNTER — Ambulatory Visit: Payer: 59 | Admitting: Internal Medicine

## 2016-01-20 ENCOUNTER — Encounter: Payer: Self-pay | Admitting: Family Medicine

## 2016-01-21 ENCOUNTER — Encounter: Payer: Self-pay | Admitting: Gastroenterology

## 2016-01-21 ENCOUNTER — Telehealth: Payer: Self-pay | Admitting: Gastroenterology

## 2016-01-21 DIAGNOSIS — N760 Acute vaginitis: Secondary | ICD-10-CM

## 2016-01-21 MED ORDER — FLUCONAZOLE 150 MG PO TABS: 150.0000 mg | ORAL_TABLET | Freq: Every day | ORAL | Status: DC

## 2016-01-21 NOTE — Telephone Encounter (Signed)
Patient requested treatment for yeast infection while on antibiotics for H pylori. Order placed for one time dose of fluconazole x 150mg 

## 2016-01-24 ENCOUNTER — Encounter: Payer: Self-pay | Admitting: Internal Medicine

## 2016-01-24 ENCOUNTER — Telehealth: Payer: Self-pay | Admitting: Gastroenterology

## 2016-01-24 ENCOUNTER — Encounter: Payer: Self-pay | Admitting: *Deleted

## 2016-01-24 ENCOUNTER — Ambulatory Visit (INDEPENDENT_AMBULATORY_CARE_PROVIDER_SITE_OTHER): Payer: 59 | Admitting: Internal Medicine

## 2016-01-24 VITALS — BP 122/72 | HR 83 | Temp 98.0°F | Ht 65.0 in | Wt 225.0 lb

## 2016-01-24 DIAGNOSIS — G4733 Obstructive sleep apnea (adult) (pediatric): Secondary | ICD-10-CM

## 2016-01-24 NOTE — Telephone Encounter (Signed)
Spoke with patient and she is c/o nausea and diarrhea d/t Pylera. States she left work early yesterday and is out today. She is asking for a note for work. Explained we usually do not do this for antibiotics. She reports she has Zofran and is taking it. Please,  Advise.

## 2016-01-24 NOTE — Assessment & Plan Note (Signed)
Very mild obstructive sleep apnea. She describes daytime sleepiness and loud snoring which she hopes to improve. We discussed conservative and interventional treatment options. She would like orthodontic opinion about an oral appliance. I emphasized importance of good sleep habits, appropriate body weight, sleeping off flat of back and treatment for any significant nasal congestion as well. Plan-referral to consider oral appliance therapy for obstructive sleep apnea with snoring and hypersomnolence.

## 2016-01-24 NOTE — Telephone Encounter (Signed)
Thanks okay if it is truly causing her symptoms to be so severe. Otherwise, if she cannot tolerate it she can let us know but would prefer to use this regimen if at all possible. She should take scheduled zofran to ensure she tolerates it. She should not use any alcohol when taking this as it will make her sick as well. Thanks

## 2016-01-24 NOTE — Telephone Encounter (Signed)
Patient given recommendations. Letter up front for pick up.

## 2016-01-24 NOTE — Progress Notes (Signed)
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 No sleep study. Dr Birdie Riddle got hx loud snoring, fatigue Bedtime between 10 PM and 1 AM, estimating sleep latency 2 minutes and waking once before up between 7 and 9:30 AM. 30 pound weight gain in the last 2 years. Occasional caffeine as soft drinks. No sleeping pills. ENT surgery-none. Some seasonal allergic rhinitis mainly in the fall. Thyroid remotely tested normal. Lives alone.  01/24/2016-40 year old female never smoker followed for OSA, snoring, allergic rhinitis Unattended Home Sleep Test 01/06/2016-AHI 5.4 per hour with desaturation to 85%, body weight 227 pounds FOLLOWS FOR: Review HST results with patient; Pt recently dx'd wiht H.Pylori-on abx for past 4 days. Still complains of depressive sense of fatigue/sleepiness only partly related to her long hours at work schedule. Aware of loud snoring. We reviewed her sleep study and she does wish to see if treatment could make her feel better.  ROS-see HPI   Negative unless "+" Constitutional:    weight loss, night sweats, fevers, chills, + fatigue, lassitude. HEENT:    headaches, + difficulty swallowing, tooth/dental problems, sore throat,       sneezing, + itching, ear ache, nasal congestion, post nasal drip, snoring CV:    chest pain, orthopnea, PND, swelling in lower extremities, anasarca,                                                     dizziness, palpitations Resp:   + shortness of breath with exertion or at rest.               + productive cough,   non-productive cough, coughing up of blood.              change in color of mucus.  wheezing.   Skin:    rash or lesions. GI:  No-   heartburn, indigestion, abdominal pain, nausea, vomiting, diarrhea,                 change in bowel habits, loss of appetite GU: dysuria, change in color of urine, no urgency or  frequency.   flank pain. MS:   + joint pain, stiffness, decreased range of motion, back pain. Neuro-     nothing unusual Psych:  change in mood or affect.  depression or anxiety.   memory loss.  OBJ- Physical Exam General- Alert, Oriented, Affect-appropriate, Distress- none acute, + overweight Skin- rash-none, lesions- none, excoriation- none Lymphadenopathy- none Head- atraumatic            Eyes- Gross vision intact, PERRLA, conjunctivae and secretions clear            Ears- Hearing, + cerumen            Nose- + turbinate edema, no-Septal dev, mucus, polyps, erosion, perforation             Throat- Mallampati III-IV , mucosa clear , drainage- none, tonsils- atrophic Neck- flexible , trachea midline, no stridor , thyroid nl, carotid no bruit Chest - symmetrical excursion , unlabored           Heart/CV- RRR , no murmur , no gallop  , no rub, nl s1 s2                           -  JVD- none , edema- none, stasis changes- none, varices- none           Lung- clear to P&A, wheeze- none, cough- none , dullness-none, rub- none           Chest wall-  Abd-  Br/ Gen/ Rectal- Not done, not indicated Extrem- cyanosis- none, clubbing, none, atrophy- none, strength- nl Neuro- grossly intact to observation

## 2016-01-24 NOTE — Patient Instructions (Signed)
Order- referral to Orthodontist Dr Oneal Grout for dx OSA

## 2016-01-25 ENCOUNTER — Other Ambulatory Visit: Payer: Self-pay | Admitting: Family Medicine

## 2016-01-25 NOTE — Telephone Encounter (Signed)
Medication filled to pharmacy as requested.   

## 2016-02-02 ENCOUNTER — Encounter: Payer: Self-pay | Admitting: Gastroenterology

## 2016-02-24 ENCOUNTER — Telehealth: Payer: Self-pay | Admitting: *Deleted

## 2016-02-24 NOTE — Telephone Encounter (Signed)
-----   Message from Hulan Saas, RN sent at 01/19/2016 10:20 AM EST ----- Call and remind patient due for stool antigen for SA. Lab in.

## 2016-02-24 NOTE — Telephone Encounter (Signed)
Patient will pick up stool kit for h. Pylori testing.

## 2016-02-24 NOTE — Telephone Encounter (Signed)
Left a message for patient to call back. 

## 2016-03-01 ENCOUNTER — Other Ambulatory Visit: Payer: 59

## 2016-03-01 DIAGNOSIS — A048 Other specified bacterial intestinal infections: Secondary | ICD-10-CM

## 2016-03-02 ENCOUNTER — Encounter: Payer: Self-pay | Admitting: Gastroenterology

## 2016-03-03 ENCOUNTER — Telehealth: Payer: Self-pay | Admitting: *Deleted

## 2016-03-03 LAB — H. PYLORI ANTIGEN, STOOL: H PYLORI AG STL: NEGATIVE

## 2016-03-03 NOTE — Telephone Encounter (Signed)
Patient is not having any pain just some bleeding. She will add the fiber supplement. She will call back if does not get better.

## 2016-03-03 NOTE — Telephone Encounter (Signed)
Left a message for patient to call back.(discuss rectal bleeding that she sent pt. Advise request about)

## 2016-03-03 NOTE — Telephone Encounter (Signed)
Spoke with patient and she states she is having bright, red blood on tissue and in toilet when she has a bowel movement. States her stools are small and soft. She does not strain. She does feel like she always has to have a bowel movement. She reports she has had this off and on for years. She saw Dr. Olevia Perches for this in past and was told she had a fissure. She was given Analpram Cream 2.5 % and she reports she got better. Please, advise.

## 2016-03-03 NOTE — Telephone Encounter (Signed)
Yes she had a colonoscopy in 2013 for this issue, had one benign left sided hyperplastic polyp. No obvious hemorrhoids although hemorrhoidal bleeding is possible. I would recommend a daily fiber supplement. If she is having perianal pain concerning for a fissure we can also give her nitroglycerine 0.125% ointment to use TID. If this does not help and symptoms persist we should re-evaluate her in the clinic. Thanks

## 2016-03-03 NOTE — Telephone Encounter (Signed)
Left a message for patient to call back. 

## 2016-03-09 ENCOUNTER — Encounter: Payer: Self-pay | Admitting: Gastroenterology

## 2016-03-09 ENCOUNTER — Other Ambulatory Visit: Payer: Self-pay | Admitting: General Practice

## 2016-03-09 MED ORDER — HYDROCHLOROTHIAZIDE 25 MG PO TABS: ORAL_TABLET | ORAL | Status: DC

## 2016-03-27 ENCOUNTER — Telehealth: Payer: Self-pay | Admitting: Gastroenterology

## 2016-03-27 MED ORDER — HYDROCORTISONE ACETATE 25 MG RE SUPP: 25.0000 mg | Freq: Two times a day (BID) | RECTAL | Status: DC | PRN

## 2016-03-27 NOTE — Telephone Encounter (Signed)
Rx sent to pharmacy. Scheduled OV on 04/19/16 with Dr. Havery Moros.

## 2016-03-27 NOTE — Telephone Encounter (Signed)
She has had a prior colonoscopy, I suspect hemorrhoidal bleeding. We can try anusol to use PRN to see if this helps but ultimately recommend she see me in clinic for further evaluation, will ensure no evidence of a fissure and may consider banding if hemorrhoids are thought to be the cause. Can you let her know and help book a follow up? Thanks

## 2016-03-27 NOTE — Telephone Encounter (Signed)
Patient calling to report blood in stool and when she wipes x 1 month. States she is taking fiber tablet. She feels constipated but is having stools daily sometimes has 2 stools/day. Stools are sometimes hard and vary in color from brown to black. She denies taking iron. She reports anal itching but no pain. She is not using anything for the symptoms except fiber tablet. Please, advise.

## 2016-03-30 ENCOUNTER — Encounter: Payer: Self-pay | Admitting: Family Medicine

## 2016-03-30 MED ORDER — NORGESTIMATE-ETH ESTRADIOL 0.25-35 MG-MCG PO TABS: ORAL_TABLET | ORAL | Status: DC

## 2016-03-30 NOTE — Telephone Encounter (Signed)
Medication filled to pharmacy as requested.   

## 2016-04-19 ENCOUNTER — Ambulatory Visit (INDEPENDENT_AMBULATORY_CARE_PROVIDER_SITE_OTHER): Payer: 59 | Admitting: Gastroenterology

## 2016-04-19 ENCOUNTER — Encounter: Payer: Self-pay | Admitting: Gastroenterology

## 2016-04-19 ENCOUNTER — Other Ambulatory Visit (INDEPENDENT_AMBULATORY_CARE_PROVIDER_SITE_OTHER): Payer: 59

## 2016-04-19 VITALS — BP 110/86 | HR 68 | Ht 64.0 in | Wt 224.0 lb

## 2016-04-19 DIAGNOSIS — D649 Anemia, unspecified: Secondary | ICD-10-CM | POA: Diagnosis not present

## 2016-04-19 DIAGNOSIS — K625 Hemorrhage of anus and rectum: Secondary | ICD-10-CM

## 2016-04-19 DIAGNOSIS — K649 Unspecified hemorrhoids: Secondary | ICD-10-CM

## 2016-04-19 LAB — IBC PANEL
Iron: 50 ug/dL (ref 42–145)
SATURATION RATIOS: 12.8 % — AB (ref 20.0–50.0)
TRANSFERRIN: 279 mg/dL (ref 212.0–360.0)

## 2016-04-19 LAB — CBC WITH DIFFERENTIAL/PLATELET
BASOS ABS: 0.1 10*3/uL (ref 0.0–0.1)
Basophils Relative: 0.5 % (ref 0.0–3.0)
EOS ABS: 0.1 10*3/uL (ref 0.0–0.7)
Eosinophils Relative: 0.8 % (ref 0.0–5.0)
HEMATOCRIT: 36.7 % (ref 36.0–46.0)
HEMOGLOBIN: 12 g/dL (ref 12.0–15.0)
LYMPHS PCT: 20 % (ref 12.0–46.0)
Lymphs Abs: 2.5 10*3/uL (ref 0.7–4.0)
MCHC: 32.6 g/dL (ref 30.0–36.0)
MCV: 85.3 fl (ref 78.0–100.0)
MONOS PCT: 8.3 % (ref 3.0–12.0)
Monocytes Absolute: 1 10*3/uL (ref 0.1–1.0)
NEUTROS ABS: 8.6 10*3/uL — AB (ref 1.4–7.7)
Neutrophils Relative %: 70.4 % (ref 43.0–77.0)
PLATELETS: 521 10*3/uL — AB (ref 150.0–400.0)
RBC: 4.3 Mil/uL (ref 3.87–5.11)
RDW: 12.5 % (ref 11.5–15.5)
WBC: 12.3 10*3/uL — AB (ref 4.0–10.5)

## 2016-04-19 LAB — FERRITIN: FERRITIN: 103 ng/mL (ref 10.0–291.0)

## 2016-04-19 NOTE — Progress Notes (Signed)
HPI :  INTAKE VISIT: 40 y/o female, former patient of Dr. Olevia Perches, new to me, here for reassessment.   Patient reports she previously had a prior colonoscopy by Dr. Olevia Perches for blood in the stools, and told she had an anal fissure and was treated for this as the cause of her bleeding which has since improved.  She is here today regarding some upper tract symptoms that have been bothering her recently. She feels full easily and feels quite bloated most of the time. She reports she has eaten smaller volumes due to these symptoms. She thinks her symptoms have been ongoing for 8-10 months. She has nausea prior to eating, but does not vomit routinely, but can happen sporadically. She feels significant distension of her abdomen and some pain in her epigastric area after she eats. She has a metallic taste in her mouth which can come and go which has also been ongoing on for several month. She denies any heartburn or regurgitation. She denies any dysphagia or odynophagia. She does endorse a sense of globus present constantly. She thinks her weight has fluctuated, but no progressive weight loss. She has tried some zofran for nausea which can provide some benefit. She has also tried some omeprazole and did not think it helped, but does not take it routinely. She takes periodic NSAIDs - she thinks she maybe takes them twice per week.   She thinks her stools have been dark. She has anywhere from 2-4 BMs per day. She reports she has alternating stool forms, sometime loose, sometimes normal form. She reports she feels like it's unsettled stomach frequently. She does not notice much significant relief with a bowel movement. She has rare blood in the stool she thinks due to history of fissure, nothing like it was previously. She takes an iron supplement but not daily. She has been on this for a long time. She is on OCP which she states she does not have menses, her last was 11 months ago.   She otherwise reports she  has had a history of elevated liver enzymes, thought to be related to fatty liver, and had questions about this. Her most recent liver enzymes have been normal.   Colonoscopy 10/2012 - hyperplastic polyp in sigmoid colon  SINCE LAST VISIT:   Th patient had an EGD done on 01/12/2016 - normal exam gastric biopsies came back positive for H pylori, Pylera + prilosec given for 10 day course. Follow up H pylori testing was negative. She was also given a low FODMOP diet and bentyl to use PRN. These symptoms have intervally improved.   Blood in the stools is her main complaint at this time. She thinks this has been bothering her for 2-3 months or so. She has had this in the past in 2013, which went away and then came back. She reports red blood on the toilet paper and within the stool itself. She thinks about half of her stools have blood. She doesn't think it is painful in her perianal area. She has been passing a small amount of stools when she does use the bathroom. She doesn't think she is straining. She has tried some fiber supplement gummies which haven't helped too much. A colonoscopy was done in 2013 for similar complaints, which was normal and did not reportedly show hemorrhoids.   She reports being fatigued otherwise, and has a history of anemia. She has not had any menstrual cycles for the past year or so. She is on OCP for  this which prevents her menstrual cycles.     Past Medical History  Diagnosis Date  . Arthritis   . Blood in stool   . Heart murmur   . Obesity   . Hypertension     history of this, no meds  . Fatty liver   . Hyperplastic colon polyp 10/22/12  . Anxiety   . Thrombocytosis ([place]) 06/02/2014  . Unspecified deficiency anemia 06/02/2014  . Hypokalemia 06/02/2014  . Depression   . Lung nodule 09/16/2014  . Axillary lymphadenopathy 05/18/2015  . Helicobacter pylori gastritis      Past Surgical History  Procedure Laterality Date  . Knee surgery  3 23 13     right  .  Knee arthroscopy      left  . Breast reduction surgery  10/19/08   Family History  Problem Relation Age of Onset  . Stroke Father   . Hypertension Father   . Diabetes Father   . Hypertension Maternal Grandmother   . Stroke Maternal Grandmother   . Heart disease Maternal Grandmother   . Ovarian cancer Paternal Grandmother   . Colon cancer Neg Hx   . Heart disease Paternal Grandfather   . Stroke Paternal Grandfather   . Sickle cell anemia Cousin   . Stomach cancer Cousin     1st cousin   Social History  Substance Use Topics  . Smoking status: Never Smoker   . Smokeless tobacco: Never Used  . Alcohol Use: 0.0 oz/week    0 Standard drinks or equivalent per week     Comment: occasionally wine 1-2 glasses q2 weeks   Current Outpatient Prescriptions  Medication Sig Dispense Refill  . amLODipine (NORVASC) 5 MG tablet Take 1 tablet (5 mg total) by mouth daily. 90 tablet 3  . cetirizine (ZYRTEC) 10 MG tablet Take 1 tablet (10 mg total) by mouth daily. 30 tablet 11  . dicyclomine (BENTYL) 20 MG tablet Take 1 tablet (20 mg total) by mouth every 8 (eight) hours as needed for spasms. 60 tablet 1  . FLUoxetine (PROZAC) 20 MG tablet TAKE 1 TABLET (20 MG TOTAL) BY MOUTH DAILY. 90 tablet 0  . hydrochlorothiazide (HYDRODIURIL) 25 MG tablet TAKE 1 TABLET (25 MG TOTAL) BY MOUTH DAILY. 90 tablet 1  . norgestimate-ethinyl estradiol (SPRINTEC 28) 0.25-35 MG-MCG tablet TAKE 1 TABLET BY MOUTH DAILY. SKIP PLACEBO PILLS AND GO RIGHT INTO NEXT PACK 112 tablet 3  . potassium chloride (KLOR-CON) 20 MEQ packet Take 20 mEq by mouth once. 30 packet 6   No current facility-administered medications for this visit.   No Known Allergies   Review of Systems: All systems reviewed and negative except where noted in HPI.   CBC Latest Ref Rng 04/19/2016 12/30/2015 12/21/2015  WBC 4.0 - 10.5 K/uL 12.3(H) 13.7(H) 13.0(H)  Hemoglobin 12.0 - 15.0 g/dL 12.0 11.5(L) 12.5  Hematocrit 36.0 - 46.0 % 36.7 36.2 38.9    Platelets 150.0 - 400.0 K/uL 521.0(H) 494.0(H) 540.0(H)   Lab Results  Component Value Date   IRON 50 04/19/2016   TIBC 325 05/18/2015   FERRITIN 103.0 04/19/2016      Physical Exam: BP 110/86 mmHg  Pulse 68  Ht 5\' 4"  (1.626 m)  Wt 224 lb (101.606 kg)  BMI 38.43 kg/m2 Constitutional: Pleasant,well-developed, female in no acute distress. HEENT: Normocephalic and atraumatic. Conjunctivae are normal. No scleral icterus. Neck supple.  Cardiovascular: Normal rate, regular rhythm.  Pulmonary/chest: Effort normal and breath sounds normal. No wheezing, rales or rhonchi. Abdominal: Soft, nondistended,  nontender. Bowel sounds active throughout. There are no masses palpable. No hepatomegaly. DRE / anoscopy - no anal fissure, internal hemorrhoids noted in RP and LL position, LL hemorrhoid inflamed Extremities: no edema Lymphadenopathy: No cervical adenopathy noted. Neurological: Alert and oriented to person place and time. Skin: Skin is warm and dry. No rashes noted. Psychiatric: Normal mood and affect. Behavior is normal.   ASSESSMENT AND PLAN: 40 y/o female presenting for the following issues:  Rectal bleeding / Hemorrhoids - I suspect this is the most likely etiology for her symptoms, which have persisted despite fiber supplements. Prior colonoscopy in 2013 done for this issue and was unremarkable to an etiology, no polyp or mass lesion. I repeated a CBC today and it showed a normal Hgb, and her iron stores are normal, thus her prior iron deficiency was most likely due to menstrual cycle. I discussed treatment modalities for hemorrhoids, and offered her a banding procedure. As outlined below, she wished to proceed and the LL hemorrhoid was banded. Recommend she follow up in 2-4 weeks for another banding. I don't think colonoscopy / flex sig is needed at this time as hemorrhoids are the most likely cause, however if her symptoms persist despite banding banding, we will consider it. She  agreed  Anemia - as above, resolved, likely was due to menses. WBC and thrombocytosis persists and is stable, she can continue to follow up with Hematology for that issue.  H pylori - eradicated, symptoms improved, she can follow up as needed for symptom recurrence  Woodridge Cellar, MD [facility] Pager [phone]    PROCEDURE NOTE: The patient presents with symptomatic grade I  hemorrhoids, requesting rubber band ligation of his/her hemorrhoidal disease.  All risks, benefits and alternative forms of therapy were described and informed consent was obtained.  In the Left Lateral Decubitus position anoscopic examination revealed grade I hemorrhoids in the RP and LL position(s).  The anorectum was pre-medicated with 0.125% nitroglycerin The decision was made to band the LL internal hemorrhoid, and the Titus was used to perform band ligation without complication.  Digital anorectal examination was then performed to assure proper positioning of the band, and to adjust the banded tissue as required.  The patient was discharged home without pain or other issues.  Dietary and behavioral recommendations were given and along with follow-up instructions.     The following adjunctive treatments were recommended: Continue fiber supplement  The patient will return in 2-4 weeks for  follow-up and possible additional banding as required. No complications were encountered and the patient tolerated the procedure well.

## 2016-04-19 NOTE — Patient Instructions (Signed)
If you are age 40 or older, your body mass index should be between 23-30. Your Body mass index is 38.43 kg/(m^2). If this is out of the aforementioned range listed, please consider follow up with your Primary Care Provider.  If you are age 72 or younger, your body mass index should be between 19-25. Your Body mass index is 38.43 kg/(m^2). If this is out of the aformentioned range listed, please consider follow up with your Primary Care Provider.   We have scheduled you for a 2nd hemorrhoid banding on 05-22-2016 @ 4pm  Your physician has requested that you go to the basement for the lab work before leaving today.     HEMORRHOID BANDING PROCEDURE    FOLLOW-UP CARE   1. The procedure you have had should have been relatively painless since the banding of the area involved does not have nerve endings and there is no pain sensation.  The rubber band cuts off the blood supply to the hemorrhoid and the band may fall off as soon as 48 hours after the banding (the band may occasionally be seen in the toilet bowl following a bowel movement). You may notice a temporary feeling of fullness in the rectum which should respond adequately to plain Tylenol or Motrin.  2. Following the banding, avoid strenuous exercise that evening and resume full activity the next day.  A sitz bath (soaking in a warm tub) or bidet is soothing, and can be useful for cleansing the area after bowel movements.     3. To avoid constipation, take two tablespoons of natural wheat bran, natural oat bran, flax, Benefiber or any over the counter fiber supplement and increase your water intake to 7-8 glasses daily.    4. Unless you have been prescribed anorectal medication, do not put anything inside your rectum for two weeks: No suppositories, enemas, fingers, etc.  5. Occasionally, you may have more bleeding than usual after the banding procedure.  This is often from the untreated hemorrhoids rather than the treated one.  Don't be  concerned if there is a tablespoon or so of blood.  If there is more blood than this, lie flat with your bottom higher than your head and apply an ice pack to the area. If the bleeding does not stop within a half an hour or if you feel faint, call our office at [phone] or go to the emergency room.  6. Problems are not common; however, if there is a substantial amount of bleeding, severe pain, chills, fever or difficulty passing urine (very rare) or other problems, you should call us at [phone] or report to the nearest emergency room.  7. Do not stay seated continuously for more than 2-3 hours for a day or two after the procedure.  Tighten your buttock muscles 10-15 times every two hours and take 10-15 deep breaths every 1-2 hours.  Do not spend more than a few minutes on the toilet if you cannot empty your bowel; instead re-visit the toilet at a later time.

## 2016-05-11 ENCOUNTER — Ambulatory Visit: Payer: 59 | Admitting: Gastroenterology

## 2016-05-22 ENCOUNTER — Encounter: Payer: 59 | Admitting: Gastroenterology

## 2016-05-22 ENCOUNTER — Encounter: Payer: Self-pay | Admitting: Family Medicine

## 2016-05-23 MED ORDER — ALPRAZOLAM 0.5 MG PO TABS: 0.5000 mg | ORAL_TABLET | Freq: Two times a day (BID) | ORAL | Status: DC | PRN

## 2016-06-28 ENCOUNTER — Ambulatory Visit: Payer: 59 | Admitting: Family Medicine

## 2016-07-12 ENCOUNTER — Encounter: Payer: Self-pay | Admitting: Family Medicine

## 2016-07-12 ENCOUNTER — Ambulatory Visit (INDEPENDENT_AMBULATORY_CARE_PROVIDER_SITE_OTHER): Payer: BLUE CROSS/BLUE SHIELD | Admitting: Family Medicine

## 2016-07-12 VITALS — BP 128/80 | HR 91 | Temp 98.7°F | Resp 17 | Ht 64.0 in | Wt 225.1 lb

## 2016-07-12 DIAGNOSIS — F418 Other specified anxiety disorders: Secondary | ICD-10-CM | POA: Diagnosis not present

## 2016-07-12 DIAGNOSIS — F419 Anxiety disorder, unspecified: Secondary | ICD-10-CM

## 2016-07-12 DIAGNOSIS — I10 Essential (primary) hypertension: Secondary | ICD-10-CM

## 2016-07-12 DIAGNOSIS — F329 Major depressive disorder, single episode, unspecified: Secondary | ICD-10-CM

## 2016-07-12 DIAGNOSIS — F32A Depression, unspecified: Secondary | ICD-10-CM

## 2016-07-12 LAB — CBC WITH DIFFERENTIAL/PLATELET
BASOS ABS: 0.1 10*3/uL (ref 0.0–0.1)
Basophils Relative: 0.5 % (ref 0.0–3.0)
Eosinophils Absolute: 0.1 10*3/uL (ref 0.0–0.7)
Eosinophils Relative: 0.6 % (ref 0.0–5.0)
HCT: 35.8 % — ABNORMAL LOW (ref 36.0–46.0)
Hemoglobin: 11.4 g/dL — ABNORMAL LOW (ref 12.0–15.0)
LYMPHS ABS: 2.4 10*3/uL (ref 0.7–4.0)
LYMPHS PCT: 21.9 % (ref 12.0–46.0)
MCHC: 31.8 g/dL (ref 30.0–36.0)
MCV: 86.3 fl (ref 78.0–100.0)
MONOS PCT: 7.2 % (ref 3.0–12.0)
Monocytes Absolute: 0.8 10*3/uL (ref 0.1–1.0)
NEUTROS PCT: 69.8 % (ref 43.0–77.0)
Neutro Abs: 7.7 10*3/uL (ref 1.4–7.7)
Platelets: 511 10*3/uL — ABNORMAL HIGH (ref 150.0–400.0)
RBC: 4.15 Mil/uL (ref 3.87–5.11)
RDW: 12.6 % (ref 11.5–15.5)
WBC: 11 10*3/uL — ABNORMAL HIGH (ref 4.0–10.5)

## 2016-07-12 LAB — BASIC METABOLIC PANEL
BUN: 11 mg/dL (ref 6–23)
CALCIUM: 9.4 mg/dL (ref 8.4–10.5)
CO2: 30 mEq/L (ref 19–32)
Chloride: 102 mEq/L (ref 96–112)
Creatinine, Ser: 0.99 mg/dL (ref 0.40–1.20)
GFR: 79.98 mL/min (ref >60.00)
Glucose, Bld: 78 mg/dL (ref 70–99)
POTASSIUM: 3.7 meq/L (ref 3.5–5.1)
SODIUM: 137 meq/L (ref 135–145)

## 2016-07-12 LAB — TSH: TSH: 1.54 u[IU]/mL (ref 0.35–4.50)

## 2016-07-12 NOTE — Patient Instructions (Signed)
Follow up in 6-8 weeks to recheck mood We'll notify you of your lab results and make any changes if needed Consider increasing the Prozac and/or starting counseling for all that you're going through Try and start exercising regularly- this will be a good stress outlet and improve your stamina Call with any questions or concerns Hang in there!!!

## 2016-07-12 NOTE — Progress Notes (Signed)
   Subjective:    Patient ID: Pamela Clarke, female    DOB: [birth year], 40 y.o.   MRN: [id number]  HPI HTN- chronic problem, on Amlodipine and HCTZ daily w/ good control.  Denies SOB, HAs, visual changes, edema.  Continues to have intermittent CP but pt feels this is stress related (father had MI in May and she saw him be resuscitated).  + fatigue.    Depression- deteriorated.  Father had massive MI in May and needed to be resuscitated.  This has dramatically impacted pt as she prepares to lose her father b/c he is not a surgical candidate.  She is working in [employer], living in [place], father is in [place] and she's trying to do it all.  Tearful during exam today.  Overwhelmed.  Using sleep as an escape.  Review of Systems For ROS see HPI     Objective:   Physical Exam  Constitutional: She is oriented to person, place, and time. She appears well-developed and well-nourished. No distress.  obese  HENT:  Head: Normocephalic and atraumatic.  Eyes: Conjunctivae and EOM are normal. Pupils are equal, round, and reactive to light.  Neck: Normal range of motion. Neck supple. No thyromegaly present.  Cardiovascular: Normal rate, regular rhythm, normal heart sounds and intact distal pulses.   No murmur heard. Pulmonary/Chest: Effort normal and breath sounds normal. No respiratory distress.  Abdominal: Soft. She exhibits no distension. There is no tenderness.  Musculoskeletal: She exhibits no edema.  Lymphadenopathy:    She has no cervical adenopathy.  Neurological: She is alert and oriented to person, place, and time.  Skin: Skin is warm and dry.  Psychiatric: Her behavior is normal. Thought content normal.  Tearful throughout visit  Vitals reviewed.         Assessment & Plan:

## 2016-07-12 NOTE — Progress Notes (Signed)
Pre visit review using our clinic review tool, if applicable. No additional management support is needed unless otherwise documented below in the visit note. 

## 2016-07-12 NOTE — Assessment & Plan Note (Signed)
Deteriorated.  Pt is having a difficult time dealing w/ her dad's health issues after having a strained relationship w/ him.  They are again becoming close but this makes her fearful of losing him.  Discussed possibility of increasing Prozac but pt declines at this time.  Strongly encouraged counseling- she will think about it.  I suspect that pt's fatigue is directly related to her anxiety and depression.  Will follow closely.

## 2016-07-12 NOTE — Assessment & Plan Note (Signed)
Chronic problem.  Adequate control.  Continues to have CP but this has been extensively worked up and I suspect that this is due to stress/anxiety.  Check labs.  No anticipated med changes.

## 2016-07-13 ENCOUNTER — Other Ambulatory Visit: Payer: Self-pay | Admitting: Family Medicine

## 2016-07-14 ENCOUNTER — Other Ambulatory Visit: Payer: Self-pay | Admitting: Family Medicine

## 2016-08-21 ENCOUNTER — Ambulatory Visit: Payer: Self-pay | Admitting: Family Medicine

## 2016-09-01 ENCOUNTER — Other Ambulatory Visit: Payer: Self-pay | Admitting: Family Medicine

## 2016-12-27 ENCOUNTER — Encounter: Payer: Self-pay | Admitting: Family Medicine

## 2016-12-27 ENCOUNTER — Ambulatory Visit (INDEPENDENT_AMBULATORY_CARE_PROVIDER_SITE_OTHER): Payer: BLUE CROSS/BLUE SHIELD | Admitting: Family Medicine

## 2016-12-27 ENCOUNTER — Ambulatory Visit (HOSPITAL_BASED_OUTPATIENT_CLINIC_OR_DEPARTMENT_OTHER)
Admission: RE | Admit: 2016-12-27 | Discharge: 2016-12-27 | Disposition: A | Discharge status: Home / Self Care | Payer: BLUE CROSS/BLUE SHIELD | Source: Ambulatory Visit | Attending: Family Medicine | Admitting: Family Medicine

## 2016-12-27 VITALS — BP 132/84 | HR 79 | Temp 98.2°F | Resp 17 | Ht 64.0 in | Wt 226.5 lb

## 2016-12-27 DIAGNOSIS — L853 Xerosis cutis: Secondary | ICD-10-CM

## 2016-12-27 DIAGNOSIS — R61 Generalized hyperhidrosis: Secondary | ICD-10-CM | POA: Diagnosis not present

## 2016-12-27 DIAGNOSIS — R05 Cough: Secondary | ICD-10-CM | POA: Diagnosis not present

## 2016-12-27 DIAGNOSIS — R059 Cough, unspecified: Secondary | ICD-10-CM

## 2016-12-27 LAB — CBC WITH DIFFERENTIAL/PLATELET
BASOS PCT: 0.5 % (ref 0.0–3.0)
Basophils Absolute: 0.1 10*3/uL (ref 0.0–0.1)
EOS PCT: 1 % (ref 0.0–5.0)
Eosinophils Absolute: 0.1 10*3/uL (ref 0.0–0.7)
HEMATOCRIT: 35.9 % — AB (ref 36.0–46.0)
Hemoglobin: 11.6 g/dL — ABNORMAL LOW (ref 12.0–15.0)
Lymphocytes Relative: 25.5 % (ref 12.0–46.0)
Lymphs Abs: 2.7 10*3/uL (ref 0.7–4.0)
MCHC: 32.2 g/dL (ref 30.0–36.0)
MCV: 86.5 fl (ref 78.0–100.0)
MONO ABS: 0.7 10*3/uL (ref 0.1–1.0)
MONOS PCT: 6.7 % (ref 3.0–12.0)
Neutro Abs: 6.9 10*3/uL (ref 1.4–7.7)
Neutrophils Relative %: 66.3 % (ref 43.0–77.0)
Platelets: 501 10*3/uL — ABNORMAL HIGH (ref 150.0–400.0)
RBC: 4.15 Mil/uL (ref 3.87–5.11)
RDW: 13.2 % (ref 11.5–15.5)
WBC: 10.4 10*3/uL (ref 4.0–10.5)

## 2016-12-27 LAB — BASIC METABOLIC PANEL
BUN: 12 mg/dL (ref 6–23)
CHLORIDE: 104 meq/L (ref 96–112)
CO2: 29 mEq/L (ref 19–32)
CREATININE: 0.96 mg/dL (ref 0.40–1.20)
Calcium: 9.3 mg/dL (ref 8.4–10.5)
GFR: 82.67 mL/min (ref >60.00)
Glucose, Bld: 94 mg/dL (ref 70–99)
POTASSIUM: 4.2 meq/L (ref 3.5–5.1)
Sodium: 138 mEq/L (ref 135–145)

## 2016-12-27 LAB — TSH: TSH: 1.98 u[IU]/mL (ref 0.35–4.50)

## 2016-12-27 LAB — HEPATIC FUNCTION PANEL
ALBUMIN: 3.9 g/dL (ref 3.5–5.2)
ALT: 18 U/L (ref 0–35)
AST: 21 U/L (ref 0–37)
Alkaline Phosphatase: 98 U/L (ref 39–117)
BILIRUBIN TOTAL: 0.5 mg/dL (ref 0.2–1.2)
Bilirubin, Direct: 0.1 mg/dL (ref 0.0–0.3)
Total Protein: 7.3 g/dL (ref 6.0–8.3)

## 2016-12-27 MED ORDER — TRIAMCINOLONE ACETONIDE 0.1 % EX OINT: 1.0000 "application " | TOPICAL_OINTMENT | Freq: Two times a day (BID) | CUTANEOUS | 1 refills | Status: DC

## 2016-12-27 NOTE — Progress Notes (Signed)
Pre visit review using our clinic review tool, if applicable. No additional management support is needed unless otherwise documented below in the visit note. 

## 2016-12-27 NOTE — Patient Instructions (Addendum)
Schedule your complete physical in 6 months We'll notify you of your lab results and make any changes if needed Please go to the [facility] and get your chest xray- no appt needed Use the Triamcinolone ointment up to twice daily to improve your dry skin Drink plenty of water- this will also help w/ dry skin Call with any questions or concerns Hang in there!!!

## 2016-12-27 NOTE — Progress Notes (Signed)
   Subjective:    Patient ID: Pamela Clarke, female    DOB: 1976/11/02, 41 y.o.   MRN: [id number]  HPI 'general unwell feeling'- pt got the flu over Christmas and she hasn't felt well since then.  Having nightly night sweats- 'like somebody poured water on my bed'.  Taking birth control daily.  No fevers.  + cough.  Cough is intermittently productive.  + sick contacts.  Pt is having itching of trunk and legs- 'for awhile'.  Denies dry skin, using lotion regularly.  Some SOB.   Review of Systems For ROS see HPI     Objective:   Physical Exam  Constitutional: She is oriented to person, place, and time. She appears well-developed and well-nourished. No distress.  HENT:  Head: Normocephalic and atraumatic.  Nose: Nose normal.  Mouth/Throat: Oropharynx is clear and moist. No oropharyngeal exudate.  TMs WNL bilaterally  Eyes: Conjunctivae and EOM are normal. Pupils are equal, round, and reactive to light.  Neck: Normal range of motion. Neck supple. No thyromegaly present.  Cardiovascular: Normal rate, regular rhythm, normal heart sounds and intact distal pulses.   No murmur heard. Pulmonary/Chest: Effort normal and breath sounds normal. No respiratory distress.  No cough heard  Abdominal: Soft. She exhibits no distension. There is no tenderness.  Musculoskeletal: She exhibits no edema.  Lymphadenopathy:    She has no cervical adenopathy.  Neurological: She is alert and oriented to person, place, and time.  Skin: Skin is warm and dry.  Psychiatric: She has a normal mood and affect. Her behavior is normal.  Vitals reviewed.         Assessment & Plan:  Dry skin- new.  Start Triamcinolone ointment since OTC lotion is not working.  Encouraged increased hydration.  Will follow  Cough- none heard during visit today but cough in combo w/ night sweats is concerning.  Will get CXR and TB test to assess for possible infection/exposure.  May also be residual cough from recent flu.  No abx  at this time as lungs were clear.  Reviewed supportive care and red flags that should prompt return.  Pt expressed understanding and is in agreement w/ plan.   Night sweats- recurrent problem for pt.  She has had complete work up previously- Hematology, ID, pulm, cards.  Will get labs to assess for underlying infectious or metabolic cause.  Will follow closely.

## 2016-12-29 LAB — QUANTIFERON TB GOLD ASSAY (BLOOD)
Interferon Gamma Release Assay: NEGATIVE
MITOGEN-NIL SO: 7.63 [IU]/mL
QUANTIFERON NIL VALUE: 0.03 [IU]/mL

## 2017-01-12 ENCOUNTER — Other Ambulatory Visit: Payer: Self-pay | Admitting: Family Medicine

## 2017-01-24 ENCOUNTER — Other Ambulatory Visit: Payer: Self-pay | Admitting: Family Medicine

## 2017-03-18 ENCOUNTER — Other Ambulatory Visit: Payer: Self-pay | Admitting: Family Medicine

## 2017-03-30 ENCOUNTER — Other Ambulatory Visit: Payer: Self-pay | Admitting: Family Medicine

## 2017-04-06 ENCOUNTER — Encounter: Payer: Self-pay | Admitting: Family Medicine

## 2017-06-22 ENCOUNTER — Other Ambulatory Visit: Payer: Self-pay | Admitting: Family Medicine

## 2017-06-27 ENCOUNTER — Encounter: Payer: Self-pay | Admitting: General Practice

## 2017-06-27 ENCOUNTER — Encounter: Payer: Self-pay | Admitting: Family Medicine

## 2017-06-27 ENCOUNTER — Ambulatory Visit (INDEPENDENT_AMBULATORY_CARE_PROVIDER_SITE_OTHER): Payer: 59 | Admitting: Family Medicine

## 2017-06-27 VITALS — BP 123/83 | HR 84 | Temp 98.0°F | Resp 16 | Ht 64.0 in | Wt 217.2 lb

## 2017-06-27 DIAGNOSIS — Z1231 Encounter for screening mammogram for malignant neoplasm of breast: Secondary | ICD-10-CM | POA: Diagnosis not present

## 2017-06-27 DIAGNOSIS — Z0000 Encounter for general adult medical examination without abnormal findings: Secondary | ICD-10-CM

## 2017-06-27 LAB — BASIC METABOLIC PANEL
BUN: 10 mg/dL (ref 6–23)
CALCIUM: 9.8 mg/dL (ref 8.4–10.5)
CO2: 31 meq/L (ref 19–32)
Chloride: 102 mEq/L (ref 96–112)
Creatinine, Ser: 0.98 mg/dL (ref 0.40–1.20)
GFR: 80.53 mL/min (ref >60.00)
GLUCOSE: 93 mg/dL (ref 70–99)
POTASSIUM: 4.1 meq/L (ref 3.5–5.1)
SODIUM: 140 meq/L (ref 135–145)

## 2017-06-27 LAB — CBC WITH DIFFERENTIAL/PLATELET
BASOS ABS: 0.1 10*3/uL (ref 0.0–0.1)
BASOS PCT: 0.5 % (ref 0.0–3.0)
Eosinophils Absolute: 0.1 10*3/uL (ref 0.0–0.7)
Eosinophils Relative: 0.6 % (ref 0.0–5.0)
HEMATOCRIT: 37 % (ref 36.0–46.0)
Hemoglobin: 11.8 g/dL — ABNORMAL LOW (ref 12.0–15.0)
LYMPHS ABS: 2.2 10*3/uL (ref 0.7–4.0)
LYMPHS PCT: 21.9 % (ref 12.0–46.0)
MCHC: 32 g/dL (ref 30.0–36.0)
MCV: 87.1 fl (ref 78.0–100.0)
MONOS PCT: 7 % (ref 3.0–12.0)
Monocytes Absolute: 0.7 10*3/uL (ref 0.1–1.0)
NEUTROS ABS: 6.9 10*3/uL (ref 1.4–7.7)
Neutrophils Relative %: 70 % (ref 43.0–77.0)
Platelets: 538 10*3/uL — ABNORMAL HIGH (ref 150.0–400.0)
RBC: 4.24 Mil/uL (ref 3.87–5.11)
RDW: 12.5 % (ref 11.5–15.5)
WBC: 9.8 10*3/uL (ref 4.0–10.5)

## 2017-06-27 LAB — HEPATIC FUNCTION PANEL
ALK PHOS: 110 U/L (ref 39–117)
ALT: 31 U/L (ref 0–35)
AST: 21 U/L (ref 0–37)
Albumin: 4 g/dL (ref 3.5–5.2)
BILIRUBIN DIRECT: 0.1 mg/dL (ref 0.0–0.3)
BILIRUBIN TOTAL: 0.6 mg/dL (ref 0.2–1.2)
TOTAL PROTEIN: 7.3 g/dL (ref 6.0–8.3)

## 2017-06-27 LAB — LIPID PANEL
CHOLESTEROL: 107 mg/dL (ref 0–200)
HDL: 53.2 mg/dL (ref >39.00)
LDL CALC: 40 mg/dL (ref 0–99)
NONHDL: 53.72
Total CHOL/HDL Ratio: 2
Triglycerides: 70 mg/dL (ref 0.0–149.0)
VLDL: 14 mg/dL (ref 0.0–40.0)

## 2017-06-27 LAB — TSH: TSH: 1.9 u[IU]/mL (ref 0.35–4.50)

## 2017-06-27 LAB — VITAMIN D 25 HYDROXY (VIT D DEFICIENCY, FRACTURES): VITD: 29.68 ng/mL — AB (ref 30.00–100.00)

## 2017-06-27 MED ORDER — DICYCLOMINE HCL 20 MG PO TABS: 20.0000 mg | ORAL_TABLET | Freq: Three times a day (TID) | ORAL | 1 refills | Status: DC

## 2017-06-27 NOTE — Assessment & Plan Note (Signed)
Pt's PE WNL w/ exception of obesity.  UTD on pap, due for mammo (order entered).  UTD on immunizations.  Check labs.  Anticipatory guidance provided.

## 2017-06-27 NOTE — Progress Notes (Signed)
Pre visit review using our clinic review tool, if applicable. No additional management support is needed unless otherwise documented below in the visit note. 

## 2017-06-27 NOTE — Patient Instructions (Addendum)
Follow up in 6 months to recheck BP We'll notify you of your lab results and make any changes if needed Continue to work on healthy diet and regular exercise- you look great!! I put the order in for your mammogram at the [facility] are due for pap next year When you have abdominal pain/diarrhea- take the Dicyclomine Call with any questions or concerns CONGRATS!!!

## 2017-06-27 NOTE — Progress Notes (Signed)
   Subjective:    Patient ID: Pamela Clarke, female    DOB: 1975-12-13, 41 y.o.   MRN: [id number]  HPI CPE-UTD on pap  Due for mammo   Review of Systems Patient reports no vision/ hearing changes, adenopathy,fever, weight change,  persistant/recurrent hoarseness , swallowing issues, chest pain, palpitations, edema, persistant/recurrent cough, hemoptysis, dyspnea (rest/exertional/paroxysmal nocturnal), gastrointestinal bleeding (melena, rectal bleeding), abdominal pain, significant heartburn, GU symptoms (dysuria, hematuria, incontinence), Gyn symptoms (abnormal  bleeding, pain),  syncope, focal weakness, memory loss, numbness & tingling, skin/hair/nail changes, abnormal bruising or bleeding, anxiety, or depression.   + diarrhea w/ abdominal cramping intermittently    Objective:   Physical Exam General Appearance:    Alert, cooperative, no distress, appears stated age  Head:    Normocephalic, without obvious abnormality, atraumatic  Eyes:    PERRL, conjunctiva/corneas clear, EOM's intact, fundi    benign, both eyes  Ears:    Normal TM's and external ear canals, both ears  Nose:   Nares normal, septum midline, mucosa normal, no drainage    or sinus tenderness  Throat:   Lips, mucosa, and tongue normal; teeth and gums normal  Neck:   Supple, symmetrical, trachea midline, no adenopathy;    Thyroid: no enlargement/tenderness/nodules  Back:     Symmetric, no curvature, ROM normal, no CVA tenderness  Lungs:     Clear to auscultation bilaterally, respirations unlabored  Chest Wall:    No tenderness or deformity   Heart:    Regular rate and rhythm, S1 and S2 normal, no murmur, rub   or gallop  Breast Exam:    Deferred to mammo  Abdomen:     Soft, non-tender, bowel sounds active all four quadrants,    no masses, no organomegaly  Genitalia:    Deferred  Rectal:    Extremities:   Extremities normal, atraumatic, no cyanosis or edema  Pulses:   2+ and symmetric all extremities  Skin:   Skin  color, texture, turgor normal, no rashes or lesions  Lymph nodes:   Cervical, supraclavicular, and axillary nodes normal  Neurologic:   CNII-XII intact, normal strength, sensation and reflexes    throughout          Assessment & Plan:

## 2017-07-10 ENCOUNTER — Other Ambulatory Visit: Payer: Self-pay | Admitting: Family Medicine

## 2017-08-07 ENCOUNTER — Other Ambulatory Visit: Payer: Self-pay | Admitting: Family Medicine

## 2017-08-20 ENCOUNTER — Other Ambulatory Visit: Payer: Self-pay | Admitting: Family Medicine

## 2017-08-20 DIAGNOSIS — Z1231 Encounter for screening mammogram for malignant neoplasm of breast: Secondary | ICD-10-CM

## 2017-09-18 ENCOUNTER — Other Ambulatory Visit: Payer: Self-pay | Admitting: Family Medicine

## 2017-10-30 DIAGNOSIS — J029 Acute pharyngitis, unspecified: Secondary | ICD-10-CM | POA: Diagnosis not present

## 2017-11-01 ENCOUNTER — Ambulatory Visit (INDEPENDENT_AMBULATORY_CARE_PROVIDER_SITE_OTHER): Payer: 59 | Admitting: Physician Assistant

## 2017-11-01 ENCOUNTER — Encounter: Payer: Self-pay | Admitting: Physician Assistant

## 2017-11-01 VITALS — BP 128/78 | HR 101 | Temp 98.7°F | Ht 64.0 in | Wt 208.0 lb

## 2017-11-01 DIAGNOSIS — B9789 Other viral agents as the cause of diseases classified elsewhere: Secondary | ICD-10-CM | POA: Diagnosis not present

## 2017-11-01 DIAGNOSIS — J069 Acute upper respiratory infection, unspecified: Secondary | ICD-10-CM

## 2017-11-01 LAB — POCT RAPID STREP A (OFFICE): Rapid Strep A Screen: NEGATIVE

## 2017-11-01 MED ORDER — FLUTICASONE PROPIONATE 50 MCG/ACT NA SUSP: 2.0000 | Freq: Every day | NASAL | 1 refills | Status: DC

## 2017-11-01 NOTE — Patient Instructions (Signed)
Please stay well-hydrated and get plenty of rest. Start a saline nasal rinse to flush out nasal passages. I think part of this sore throat is from drainage. Start Flonase (prescription) as directed. Continue salt-water gargles and chloraseptic spray. Alternate tylenol and Ibuprofen. Start an echinacea supplement to help speed recovery.  If you have a humidifier place it in the bedroom. Please call or return to clinic if symptoms are not improving over the next few days.

## 2017-11-01 NOTE — Progress Notes (Signed)
Patient presents to clinic today c/o sore throat since starting Sunday. Also notes low-grade fevers. Tmax at 100.4 without medication. Now with ear pressure, pain (left-sided), dry cough and nasal drainage. Denies sinus pain but has history of sinus infection. Was [facility] at [facility] Tuesday and had negative strep test and flu test. Has taken some Nyquil for symptom relief. Salt-water gargles are not helping with soreness.  Past Medical History:  Diagnosis Date  . Anxiety   . Arthritis   . Axillary lymphadenopathy 05/18/2015  . Blood in stool   . Depression   . Fatty liver   . Heart murmur   . Helicobacter pylori gastritis   . Hyperplastic colon polyp 10/22/12  . Hypertension    history of this, no meds  . Hypokalemia 06/02/2014  . Lung nodule 09/16/2014  . Obesity   . Thrombocytosis ([place]) 06/02/2014  . Unspecified deficiency anemia 06/02/2014    Current Outpatient Medications on File Prior to Visit  Medication Sig Dispense Refill  . amLODipine (NORVASC) 5 MG tablet TAKE 1 TABLET BY MOUTH DAILY 90 tablet 1  . cetirizine (ZYRTEC) 10 MG tablet Take 1 tablet (10 mg total) by mouth daily. 30 tablet 11  . dicyclomine (BENTYL) 20 MG tablet Take 1 tablet (20 mg total) by mouth 4 (four) times daily -  before meals and at bedtime. 60 tablet 1  . FLUoxetine (PROZAC) 20 MG tablet TAKE 1 TABLET (20 MG TOTAL) BY MOUTH DAILY. 90 tablet 1  . hydrochlorothiazide (HYDRODIURIL) 25 MG tablet TAKE 1 TABLET (25 MG TOTAL) BY MOUTH DAILY. 90 tablet 1  . SPRINTEC 28 0.25-35 MG-MCG tablet TAKE 1 TABLET BY MOUTH DAILY. SKIP PLACEBO PILLS AND GO RIGHT INTO NEXT PACK 112 tablet 0   No current facility-administered medications on file prior to visit.     No Known Allergies  Family History  Problem Relation Age of Onset  . Stroke Father   . Hypertension Father   . Diabetes Father   . Heart failure Father   . Hypertension Maternal Grandmother   . Stroke Maternal Grandmother   . Heart disease  Maternal Grandmother   . Ovarian cancer Paternal Grandmother   . Heart disease Paternal Grandfather   . Stroke Paternal Grandfather   . Sickle cell anemia Cousin   . Stomach cancer Cousin        1st cousin  . Colon cancer Neg Hx     Social History   Socioeconomic History  . Marital status: Single    Spouse name: None  . Number of children: 0  . Years of education: None  . Highest education level: None  Social Needs  . Financial resource strain: None  . Food insecurity - worry: None  . Food insecurity - inability: None  . Transportation needs - medical: None  . Transportation needs - non-medical: None  Occupational History  . Occupation: Marine scientist: [employer]  Tobacco Use  . Smoking status: Never Smoker  . Smokeless tobacco: Never Used  Substance and Sexual Activity  . Alcohol use: Yes    Alcohol/week: 0.0 oz    Comment: occasionally wine 1-2 glasses q2 weeks  . Drug use: No  . Sexual activity: Yes    Birth control/protection: None  Other Topics Concern  . None  Social History Narrative   Lives w/ female partner    Review of Systems - See HPI.  All other ROS are negative.  BP 128/78 (BP Location:  Left Arm, Patient Position: Sitting, Cuff Size: Normal)   Pulse (!) 101   Temp 98.7 F (37.1 C) (Oral)   Ht 5\' 4"  (1.626 m)   Wt 208 lb (94.3 kg)   SpO2 98%   BMI 35.70 kg/m   Physical Exam  Constitutional: She is oriented to person, place, and time and well-developed, well-nourished, and in no distress.  HENT:  Head: Normocephalic and atraumatic.  Right Ear: Tympanic membrane normal.  Left Ear: A middle ear effusion (serous) is present.  Nose: Rhinorrhea present. No mucosal edema. Right sinus exhibits no maxillary sinus tenderness and no frontal sinus tenderness. Left sinus exhibits no maxillary sinus tenderness and no frontal sinus tenderness.  Mouth/Throat: Uvula is midline. Posterior oropharyngeal erythema present. No oropharyngeal exudate or  posterior oropharyngeal edema.  Thick PND noted draining in posterior oropharynx  Eyes: Conjunctivae are normal. Pupils are equal, round, and reactive to light.  Neck: Neck supple.  Cardiovascular: Normal rate, regular rhythm, normal heart sounds and intact distal pulses.  Pulmonary/Chest: Effort normal and breath sounds normal. No respiratory distress. She has no wheezes. She has no rales. She exhibits no tenderness.  Lymphadenopathy:    She has no cervical adenopathy.  Neurological: She is alert and oriented to person, place, and time.  Skin: Skin is warm and dry. No rash noted.  Psychiatric: Affect normal.  Vitals reviewed.  Assessment/Plan: 1. Viral URI with cough Strep again negative. Symptoms and exam consistent with viral URI. Supportive measures and OTC medications reviewed. Start Flonase. Follow-up if not improving. - fluticasone (FLONASE) 50 MCG/ACT nasal spray; Place 2 sprays into both nostrils daily.  Dispense: 16 g; Refill: 1 - POCT rapid strep A   Leeanne Rio, PA-C

## 2017-11-02 ENCOUNTER — Encounter: Payer: Self-pay | Admitting: Physician Assistant

## 2017-11-03 DIAGNOSIS — R05 Cough: Secondary | ICD-10-CM | POA: Diagnosis not present

## 2017-11-03 DIAGNOSIS — J018 Other acute sinusitis: Secondary | ICD-10-CM | POA: Diagnosis not present

## 2017-11-03 DIAGNOSIS — R6889 Other general symptoms and signs: Secondary | ICD-10-CM | POA: Diagnosis not present

## 2017-11-04 ENCOUNTER — Encounter: Payer: Self-pay | Admitting: Family Medicine

## 2017-11-14 DIAGNOSIS — Z79899 Other long term (current) drug therapy: Secondary | ICD-10-CM | POA: Diagnosis not present

## 2017-11-14 DIAGNOSIS — R072 Precordial pain: Secondary | ICD-10-CM | POA: Diagnosis not present

## 2017-11-14 DIAGNOSIS — I10 Essential (primary) hypertension: Secondary | ICD-10-CM | POA: Diagnosis not present

## 2017-11-14 DIAGNOSIS — H6592 Unspecified nonsuppurative otitis media, left ear: Secondary | ICD-10-CM | POA: Diagnosis not present

## 2017-11-15 ENCOUNTER — Other Ambulatory Visit: Payer: Self-pay | Admitting: General Practice

## 2017-11-15 MED ORDER — FLUOXETINE HCL 20 MG PO TABS: ORAL_TABLET | ORAL | 1 refills | Status: DC

## 2017-11-18 ENCOUNTER — Encounter: Payer: Self-pay | Admitting: Family Medicine

## 2017-11-29 DIAGNOSIS — R072 Precordial pain: Secondary | ICD-10-CM | POA: Diagnosis not present

## 2017-11-29 DIAGNOSIS — N183 Chronic kidney disease, stage 3 (moderate): Secondary | ICD-10-CM | POA: Diagnosis not present

## 2017-11-29 DIAGNOSIS — I10 Essential (primary) hypertension: Secondary | ICD-10-CM | POA: Diagnosis not present

## 2017-11-29 DIAGNOSIS — R0989 Other specified symptoms and signs involving the circulatory and respiratory systems: Secondary | ICD-10-CM | POA: Diagnosis not present

## 2017-12-10 DIAGNOSIS — R0989 Other specified symptoms and signs involving the circulatory and respiratory systems: Secondary | ICD-10-CM | POA: Diagnosis not present

## 2017-12-10 DIAGNOSIS — I10 Essential (primary) hypertension: Secondary | ICD-10-CM | POA: Diagnosis not present

## 2017-12-10 DIAGNOSIS — R072 Precordial pain: Secondary | ICD-10-CM | POA: Diagnosis not present

## 2017-12-18 DIAGNOSIS — D539 Nutritional anemia, unspecified: Secondary | ICD-10-CM | POA: Diagnosis not present

## 2017-12-18 DIAGNOSIS — R232 Flushing: Secondary | ICD-10-CM | POA: Diagnosis not present

## 2017-12-18 DIAGNOSIS — K625 Hemorrhage of anus and rectum: Secondary | ICD-10-CM | POA: Diagnosis not present

## 2017-12-18 DIAGNOSIS — I10 Essential (primary) hypertension: Secondary | ICD-10-CM | POA: Diagnosis not present

## 2017-12-18 DIAGNOSIS — N183 Chronic kidney disease, stage 3 (moderate): Secondary | ICD-10-CM | POA: Diagnosis not present

## 2017-12-25 ENCOUNTER — Ambulatory Visit (INDEPENDENT_AMBULATORY_CARE_PROVIDER_SITE_OTHER): Payer: 59 | Admitting: Family Medicine

## 2017-12-25 ENCOUNTER — Other Ambulatory Visit: Payer: Self-pay | Admitting: Family Medicine

## 2017-12-25 ENCOUNTER — Other Ambulatory Visit: Payer: Self-pay

## 2017-12-25 ENCOUNTER — Encounter: Payer: Self-pay | Admitting: Family Medicine

## 2017-12-25 VITALS — BP 122/84 | HR 89 | Temp 98.4°F | Resp 16 | Ht 64.0 in | Wt 213.0 lb

## 2017-12-25 DIAGNOSIS — R61 Generalized hyperhidrosis: Secondary | ICD-10-CM

## 2017-12-25 DIAGNOSIS — I10 Essential (primary) hypertension: Secondary | ICD-10-CM

## 2017-12-25 LAB — TSH: TSH: 1.23 u[IU]/mL (ref 0.35–4.50)

## 2017-12-25 LAB — LIPID PANEL
CHOL/HDL RATIO: 2
CHOLESTEROL: 98 mg/dL (ref 0–200)
HDL: 56.3 mg/dL (ref >39.00)
LDL CALC: 26 mg/dL (ref 0–99)
NonHDL: 41.99
TRIGLYCERIDES: 80 mg/dL (ref 0.0–149.0)
VLDL: 16 mg/dL (ref 0.0–40.0)

## 2017-12-25 LAB — HEPATIC FUNCTION PANEL
ALBUMIN: 4.1 g/dL (ref 3.5–5.2)
ALK PHOS: 103 U/L (ref 39–117)
ALT: 14 U/L (ref 0–35)
AST: 15 U/L (ref 0–37)
Bilirubin, Direct: 0.1 mg/dL (ref 0.0–0.3)
TOTAL PROTEIN: 7.4 g/dL (ref 6.0–8.3)
Total Bilirubin: 0.6 mg/dL (ref 0.2–1.2)

## 2017-12-25 LAB — BASIC METABOLIC PANEL
BUN: 11 mg/dL (ref 6–23)
CO2: 30 mEq/L (ref 19–32)
CREATININE: 0.91 mg/dL (ref 0.40–1.20)
Calcium: 9.4 mg/dL (ref 8.4–10.5)
Chloride: 102 mEq/L (ref 96–112)
GFR: 87.5 mL/min (ref >60.00)
GLUCOSE: 96 mg/dL (ref 70–99)
Potassium: 3.7 mEq/L (ref 3.5–5.1)
Sodium: 139 mEq/L (ref 135–145)

## 2017-12-25 LAB — CBC WITH DIFFERENTIAL/PLATELET
BASOS ABS: 0.1 10*3/uL (ref 0.0–0.1)
Basophils Relative: 0.7 % (ref 0.0–3.0)
Eosinophils Absolute: 0.1 10*3/uL (ref 0.0–0.7)
Eosinophils Relative: 0.9 % (ref 0.0–5.0)
HCT: 37.5 % (ref 36.0–46.0)
HEMOGLOBIN: 12.1 g/dL (ref 12.0–15.0)
LYMPHS ABS: 1.7 10*3/uL (ref 0.7–4.0)
Lymphocytes Relative: 18.3 % (ref 12.0–46.0)
MCHC: 32.2 g/dL (ref 30.0–36.0)
MCV: 86.9 fl (ref 78.0–100.0)
MONO ABS: 0.6 10*3/uL (ref 0.1–1.0)
MONOS PCT: 6.7 % (ref 3.0–12.0)
NEUTROS PCT: 73.4 % (ref 43.0–77.0)
Neutro Abs: 6.8 10*3/uL (ref 1.4–7.7)
Platelets: 529 10*3/uL — ABNORMAL HIGH (ref 150.0–400.0)
RBC: 4.31 Mil/uL (ref 3.87–5.11)
RDW: 12.7 % (ref 11.5–15.5)
WBC: 9.3 10*3/uL (ref 4.0–10.5)

## 2017-12-25 LAB — SEDIMENTATION RATE: SED RATE: 9 mm/h (ref 0–20)

## 2017-12-25 MED ORDER — NORGESTIMATE-ETH ESTRADIOL 0.25-35 MG-MCG PO TABS: ORAL_TABLET | ORAL | 0 refills | Status: DC

## 2017-12-25 NOTE — Assessment & Plan Note (Signed)
Chronic problem.  Well controlled today.  Continues to have intermittent CP and SOB w/ exertion despite normal cardiac work up at [facility].  Check labs.  No anticipated med changes.  Will follow.

## 2017-12-25 NOTE — Patient Instructions (Signed)
We'll notify you of your lab results and make any changes if needed Try and work on healthy diet and regular exercise- you can do it! If your labs are abnormal- we will send you to a specialist for review Call with any questions or concerns Hang in there!!!

## 2017-12-25 NOTE — Assessment & Plan Note (Signed)
Recurrent problem for pt over the last 5 yrs.  She has had imaging, was referred to hematology and ID (they refused to see pt).  No cause was identified.  She is very upset and feels that she is being mismanaged.  She feels she needs a bone marrow bx- but hematology told her this was unnecessary.  I explained to pt that I am not able to order a bone marrow bx.  She states that someone at [facility] in December told her that this was likely a slow growing cancer of the blood or Lymphoma and she is quite concerned about this.  Her last blood counts were completely normal.  Reviewed this w/ patient.  Will repeat labs today and determine if a repeat referral to hematology is warranted- offered this to pt but she wanted to wait for lab results.  Will continue to follow.

## 2017-12-25 NOTE — Progress Notes (Signed)
   Subjective:    Patient ID: Pamela Clarke, female    DOB: [birth year], 42 y.o.   MRN: [id number]  HPI HTN- chronic problem, on Amlodipine 5mg  daily and HCTZ 25mg  daily w/ good control.  Pt has gained 5 lbs since last visit.  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).  + SOB w/ exertion.  Denies HAs, visual changes, edema.   Night sweats- pt continues to have night sweats.  Has seen Hematology.  ID refused to see pt.     Review of Systems For ROS see HPI     Objective:   Physical Exam  Constitutional: She is oriented to person, place, and time. She appears well-developed and well-nourished. No distress.  obese  HENT:  Head: Normocephalic and atraumatic.  Eyes: Conjunctivae and EOM are normal. Pupils are equal, round, and reactive to light.  Neck: Normal range of motion. Neck supple. No thyromegaly present.  Cardiovascular: Normal rate, regular rhythm, normal heart sounds and intact distal pulses.  No murmur heard. Pulmonary/Chest: Effort normal and breath sounds normal. No respiratory distress.  Abdominal: Soft. She exhibits no distension. There is no tenderness.  Musculoskeletal: She exhibits no edema.  Lymphadenopathy:    She has no cervical adenopathy.  Neurological: She is alert and oriented to person, place, and time.  Skin: Skin is warm and dry.  Psychiatric: She has a normal mood and affect. Her behavior is normal.  Vitals reviewed.         Assessment & Plan:

## 2017-12-26 ENCOUNTER — Encounter: Payer: Self-pay | Admitting: General Practice

## 2017-12-26 LAB — ANA: Anti Nuclear Antibody(ANA): NEGATIVE

## 2018-01-05 ENCOUNTER — Other Ambulatory Visit: Payer: Self-pay | Admitting: Family Medicine

## 2018-01-10 ENCOUNTER — Other Ambulatory Visit: Payer: Self-pay

## 2018-01-10 DIAGNOSIS — R928 Other abnormal and inconclusive findings on diagnostic imaging of breast: Secondary | ICD-10-CM

## 2018-01-10 DIAGNOSIS — K625 Hemorrhage of anus and rectum: Secondary | ICD-10-CM | POA: Diagnosis not present

## 2018-01-10 DIAGNOSIS — I10 Essential (primary) hypertension: Secondary | ICD-10-CM | POA: Diagnosis not present

## 2018-01-10 DIAGNOSIS — D72829 Elevated white blood cell count, unspecified: Secondary | ICD-10-CM | POA: Diagnosis not present

## 2018-01-11 ENCOUNTER — Other Ambulatory Visit: Payer: Self-pay | Admitting: Family Medicine

## 2018-01-11 DIAGNOSIS — R928 Other abnormal and inconclusive findings on diagnostic imaging of breast: Secondary | ICD-10-CM

## 2018-01-26 DIAGNOSIS — R1084 Generalized abdominal pain: Secondary | ICD-10-CM | POA: Diagnosis not present

## 2018-01-26 DIAGNOSIS — K625 Hemorrhage of anus and rectum: Secondary | ICD-10-CM | POA: Diagnosis not present

## 2018-01-30 DIAGNOSIS — D72828 Other elevated white blood cell count: Secondary | ICD-10-CM | POA: Diagnosis not present

## 2018-01-30 DIAGNOSIS — D649 Anemia, unspecified: Secondary | ICD-10-CM | POA: Diagnosis not present

## 2018-01-31 ENCOUNTER — Ambulatory Visit
Admission: RE | Admit: 2018-01-31 | Discharge: 2018-01-31 | Disposition: A | Discharge status: Home / Self Care | Payer: 59 | Source: Ambulatory Visit | Attending: *Deleted | Admitting: *Deleted

## 2018-01-31 DIAGNOSIS — R928 Other abnormal and inconclusive findings on diagnostic imaging of breast: Secondary | ICD-10-CM

## 2018-02-13 ENCOUNTER — Other Ambulatory Visit: Payer: Self-pay | Admitting: Family Medicine

## 2018-02-13 ENCOUNTER — Other Ambulatory Visit: Payer: Self-pay | Admitting: General Practice

## 2018-02-13 DIAGNOSIS — Z8371 Family history of colonic polyps: Secondary | ICD-10-CM | POA: Diagnosis not present

## 2018-02-13 DIAGNOSIS — Z01818 Encounter for other preprocedural examination: Secondary | ICD-10-CM | POA: Diagnosis not present

## 2018-02-13 DIAGNOSIS — K648 Other hemorrhoids: Secondary | ICD-10-CM | POA: Diagnosis not present

## 2018-02-13 MED ORDER — FLUOXETINE HCL 20 MG PO TABS: ORAL_TABLET | ORAL | 1 refills | Status: DC

## 2018-03-17 ENCOUNTER — Other Ambulatory Visit: Payer: Self-pay | Admitting: Family Medicine

## 2018-04-30 DIAGNOSIS — D649 Anemia, unspecified: Secondary | ICD-10-CM | POA: Diagnosis not present

## 2018-04-30 DIAGNOSIS — D72828 Other elevated white blood cell count: Secondary | ICD-10-CM | POA: Diagnosis not present

## 2018-05-04 ENCOUNTER — Other Ambulatory Visit: Payer: Self-pay | Admitting: Family Medicine

## 2018-05-31 ENCOUNTER — Other Ambulatory Visit: Payer: Self-pay | Admitting: Family Medicine

## 2018-06-04 DIAGNOSIS — H40003 Preglaucoma, unspecified, bilateral: Secondary | ICD-10-CM | POA: Diagnosis not present

## 2018-07-29 DIAGNOSIS — M25762 Osteophyte, left knee: Secondary | ICD-10-CM | POA: Diagnosis not present

## 2018-07-29 DIAGNOSIS — M1712 Unilateral primary osteoarthritis, left knee: Secondary | ICD-10-CM | POA: Diagnosis not present

## 2018-08-01 DIAGNOSIS — M6281 Muscle weakness (generalized): Secondary | ICD-10-CM | POA: Diagnosis not present

## 2018-08-01 DIAGNOSIS — Z7409 Other reduced mobility: Secondary | ICD-10-CM | POA: Diagnosis not present

## 2018-08-01 DIAGNOSIS — M1712 Unilateral primary osteoarthritis, left knee: Secondary | ICD-10-CM | POA: Diagnosis not present

## 2018-08-16 ENCOUNTER — Other Ambulatory Visit: Payer: Self-pay | Admitting: Family Medicine

## 2018-08-26 DIAGNOSIS — Z7409 Other reduced mobility: Secondary | ICD-10-CM | POA: Diagnosis not present

## 2018-08-26 DIAGNOSIS — M1712 Unilateral primary osteoarthritis, left knee: Secondary | ICD-10-CM | POA: Diagnosis not present

## 2018-08-26 DIAGNOSIS — M6281 Muscle weakness (generalized): Secondary | ICD-10-CM | POA: Diagnosis not present

## 2018-08-27 ENCOUNTER — Encounter: Payer: Self-pay | Admitting: Family Medicine

## 2018-08-28 ENCOUNTER — Other Ambulatory Visit: Payer: Self-pay | Admitting: Family Medicine

## 2018-09-03 ENCOUNTER — Ambulatory Visit (INDEPENDENT_AMBULATORY_CARE_PROVIDER_SITE_OTHER): Payer: 59 | Admitting: Family Medicine

## 2018-09-03 ENCOUNTER — Other Ambulatory Visit: Payer: Self-pay

## 2018-09-03 ENCOUNTER — Encounter: Payer: Self-pay | Admitting: Family Medicine

## 2018-09-03 VITALS — BP 121/80 | HR 78 | Temp 98.1°F | Resp 17 | Ht 64.0 in | Wt 219.5 lb

## 2018-09-03 DIAGNOSIS — Z0000 Encounter for general adult medical examination without abnormal findings: Secondary | ICD-10-CM | POA: Diagnosis not present

## 2018-09-03 DIAGNOSIS — I10 Essential (primary) hypertension: Secondary | ICD-10-CM | POA: Diagnosis not present

## 2018-09-03 DIAGNOSIS — E559 Vitamin D deficiency, unspecified: Secondary | ICD-10-CM | POA: Diagnosis not present

## 2018-09-03 LAB — CBC WITH DIFFERENTIAL/PLATELET
BASOS PCT: 0.8 % (ref 0.0–3.0)
Basophils Absolute: 0.1 10*3/uL (ref 0.0–0.1)
EOS ABS: 0.1 10*3/uL (ref 0.0–0.7)
EOS PCT: 1 % (ref 0.0–5.0)
HCT: 36.6 % (ref 36.0–46.0)
HEMOGLOBIN: 12 g/dL (ref 12.0–15.0)
LYMPHS PCT: 21.4 % (ref 12.0–46.0)
Lymphs Abs: 2.1 10*3/uL (ref 0.7–4.0)
MCHC: 32.6 g/dL (ref 30.0–36.0)
MCV: 85.6 fl (ref 78.0–100.0)
Monocytes Absolute: 0.7 10*3/uL (ref 0.1–1.0)
Monocytes Relative: 7.2 % (ref 3.0–12.0)
Neutro Abs: 7 10*3/uL (ref 1.4–7.7)
Neutrophils Relative %: 69.6 % (ref 43.0–77.0)
Platelets: 513 10*3/uL — ABNORMAL HIGH (ref 150.0–400.0)
RBC: 4.28 Mil/uL (ref 3.87–5.11)
RDW: 12.1 % (ref 11.5–15.5)
WBC: 10 10*3/uL (ref 4.0–10.5)

## 2018-09-03 LAB — LIPID PANEL
CHOL/HDL RATIO: 2
Cholesterol: 103 mg/dL (ref 0–200)
HDL: 55.2 mg/dL (ref >39.00)
LDL Cholesterol: 29 mg/dL (ref 0–99)
NONHDL: 47.7
Triglycerides: 93 mg/dL (ref 0.0–149.0)
VLDL: 18.6 mg/dL (ref 0.0–40.0)

## 2018-09-03 LAB — HEPATIC FUNCTION PANEL
ALT: 17 U/L (ref 0–35)
AST: 17 U/L (ref 0–37)
Albumin: 4 g/dL (ref 3.5–5.2)
Alkaline Phosphatase: 81 U/L (ref 39–117)
BILIRUBIN DIRECT: 0.1 mg/dL (ref 0.0–0.3)
BILIRUBIN TOTAL: 0.5 mg/dL (ref 0.2–1.2)
Total Protein: 7.2 g/dL (ref 6.0–8.3)

## 2018-09-03 LAB — BASIC METABOLIC PANEL
BUN: 12 mg/dL (ref 6–23)
CALCIUM: 9.5 mg/dL (ref 8.4–10.5)
CHLORIDE: 100 meq/L (ref 96–112)
CO2: 29 meq/L (ref 19–32)
CREATININE: 1.01 mg/dL (ref 0.40–1.20)
GFR: 77.32 mL/min (ref >60.00)
Glucose, Bld: 98 mg/dL (ref 70–99)
Potassium: 4.1 mEq/L (ref 3.5–5.1)
Sodium: 136 mEq/L (ref 135–145)

## 2018-09-03 LAB — TSH: TSH: 1.54 u[IU]/mL (ref 0.35–4.50)

## 2018-09-03 LAB — VITAMIN D 25 HYDROXY (VIT D DEFICIENCY, FRACTURES): VITD: 32.99 ng/mL (ref 30.00–100.00)

## 2018-09-03 NOTE — Assessment & Plan Note (Signed)
Pt has hx of this.  Check labs and replete prn. 

## 2018-09-03 NOTE — Assessment & Plan Note (Signed)
Pt's PE WNL w/ exception of obesity.  UTD on mammo, due for pap- pt to call Dr Valentino Saxon.  Will get flu shot at work.  Check labs.  Anticipatory guidance provided.

## 2018-09-03 NOTE — Assessment & Plan Note (Signed)
Chronic problem.  Well controlled today.  Asymptomatic.  Check labs.  No anticipated med changes. 

## 2018-09-03 NOTE — Progress Notes (Signed)
   Subjective:    Patient ID: Pamela Clarke, female    DOB: 06-Jul-1976, 42 y.o.   MRN: [id number]  HPI CPE- due for pap, UTD on mammo, colonoscopy, immunizations.   Review of Systems Patient reports no vision/ hearing changes, adenopathy,fever, weight change,  persistant/recurrent hoarseness , swallowing issues, edema, persistant/recurrent cough, palpitations, hemoptysis, dyspnea (rest/exertional/paroxysmal nocturnal), gastrointestinal bleeding (melena, rectal bleeding), abdominal pain, significant heartburn, bowel changes, GU symptoms (dysuria, hematuria, incontinence), Gyn symptoms (abnormal  bleeding, pain),  syncope, focal weakness, memory loss, numbness & tingling, skin/hair/nail changes, abnormal bruising or bleeding, anxiety, or depression.   + CP- random, intermittent, had previous cardiac workup that was WNL    Objective:   Physical Exam General Appearance:    Alert, cooperative, no distress, appears stated age, obese  Head:    Normocephalic, without obvious abnormality, atraumatic  Eyes:    PERRL, conjunctiva/corneas clear, EOM's intact, fundi    benign, both eyes  Ears:    Normal TM's and external ear canals, both ears  Nose:   Nares normal, septum midline, mucosa normal, no drainage    or sinus tenderness  Throat:   Lips, mucosa, and tongue normal; teeth and gums normal  Neck:   Supple, symmetrical, trachea midline, no adenopathy;    Thyroid: no enlargement/tenderness/nodules  Back:     Symmetric, no curvature, ROM normal, no CVA tenderness  Lungs:     Clear to auscultation bilaterally, respirations unlabored  Chest Wall:    No tenderness or deformity   Heart:    Regular rate and rhythm, S1 and S2 normal, no murmur, rub   or gallop  Breast Exam:    Deferred to GYN  Abdomen:     Soft, non-tender, bowel sounds active all four quadrants,    no masses, no organomegaly  Genitalia:    Deferred to GYN  Rectal:    Extremities:   Extremities normal, atraumatic, no cyanosis or  edema  Pulses:   2+ and symmetric all extremities  Skin:   Skin color, texture, turgor normal, no rashes or lesions  Lymph nodes:   Cervical, supraclavicular, and axillary nodes normal  Neurologic:   CNII-XII intact, normal strength, sensation and reflexes    throughout          Assessment & Plan:

## 2018-09-03 NOTE — Patient Instructions (Addendum)
Follow up in 1 year or as needed We'll notify you of your lab results and make any changes if needed Continue to work on healthy diet and regular exercise- you can do it! CALL AND SCHEDULE YOUR PAP Call with any questions or concerns HAPPY ANNIVERSARY!!

## 2018-09-04 ENCOUNTER — Encounter: Payer: Self-pay | Admitting: General Practice

## 2018-09-25 ENCOUNTER — Other Ambulatory Visit: Payer: Self-pay | Admitting: Family Medicine

## 2018-09-26 ENCOUNTER — Other Ambulatory Visit: Payer: Self-pay | Admitting: Family Medicine

## 2018-12-02 ENCOUNTER — Other Ambulatory Visit: Payer: Self-pay | Admitting: Family Medicine

## 2018-12-02 MED ORDER — NORGESTIMATE-ETH ESTRADIOL 0.25-35 MG-MCG PO TABS: ORAL_TABLET | ORAL | 0 refills | Status: DC

## 2018-12-18 ENCOUNTER — Other Ambulatory Visit: Payer: Self-pay | Admitting: Family Medicine

## 2019-01-02 ENCOUNTER — Other Ambulatory Visit: Payer: Self-pay | Admitting: Family Medicine

## 2019-02-20 ENCOUNTER — Other Ambulatory Visit: Payer: Self-pay | Admitting: Family Medicine

## 2019-03-19 ENCOUNTER — Ambulatory Visit: Payer: Self-pay | Admitting: *Deleted

## 2019-03-19 ENCOUNTER — Other Ambulatory Visit: Payer: Self-pay

## 2019-03-19 ENCOUNTER — Ambulatory Visit (INDEPENDENT_AMBULATORY_CARE_PROVIDER_SITE_OTHER): Payer: 59 | Admitting: Family Medicine

## 2019-03-19 ENCOUNTER — Encounter: Payer: Self-pay | Admitting: Family Medicine

## 2019-03-19 VITALS — BP 164/85 | Temp 98.6°F | Ht 64.0 in | Wt 220.0 lb

## 2019-03-19 DIAGNOSIS — R6889 Other general symptoms and signs: Secondary | ICD-10-CM | POA: Diagnosis not present

## 2019-03-19 DIAGNOSIS — Z20822 Contact with and (suspected) exposure to covid-19: Secondary | ICD-10-CM

## 2019-03-19 DIAGNOSIS — Z20828 Contact with and (suspected) exposure to other viral communicable diseases: Secondary | ICD-10-CM | POA: Diagnosis not present

## 2019-03-19 NOTE — Progress Notes (Signed)
I have discussed the procedure for the virtual visit with the patient who has given consent to proceed with assessment and treatment.   Chanti Golubski, CMA     

## 2019-03-19 NOTE — Telephone Encounter (Signed)
Patient is calling to report she is having chill and muscle aches- no fever. 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 Patient states she has not been to work in 2 days. She is calling for advisement for PCP. Reviewed signs and symptoms of COVID and although she does not exhibit severe symptoms- she may still have virus or even flu- she did not get vaccine this year. Call to office to schedule appointment with PCP and there is very bad connection- message sent with high priority so appointment can be scheduled.  yReason for Disposition . COVID-19 Home Isolation, questions about . [1] MODERATE pain (e.g., interferes with normal activities) AND [2] present > 3 days  Answer Assessment - Initial Assessment Questions 1. ONSET: "When did the muscle aches or body pains start?"      Sunday night 2. LOCATION: "What part of your body is hurting?" (e.g., entire body, arms, legs)      Legs and lower back 3. SEVERITY: "How bad is the pain?" (Scale 1-10; or mild, moderate, severe)   - MILD (1-3): doesn't interfere with normal activities    - MODERATE (4-7): interferes with normal activities or awakens from sleep    - SEVERE (8-10):  excruciating pain, unable to do any normal activities      7- feels like she worked out- but she has not 4. CAUSE: "What do you think is causing the pains?"     unknown 5. FEVER: "Have you been having fever?"     No- 98.3 this morning 6. OTHER SYMPTOMS: "Do you have any other symptoms?" (e.g., chest pain, weakness, rash, cold or flu symptoms, weight loss)     Slight headache, chills, fatigue, slight cough 7. PREGNANCY: "Is there any chance you are pregnant?" "When was your last menstrual period?"     n/a 8. 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

## 2019-03-19 NOTE — Progress Notes (Signed)
Virtual Visit via Video   I connected with Pamela Clarke on 03/19/19 at  2:40 PM EDT by a video enabled telemedicine application and verified that I am speaking with the correct person using two identifiers. Location patient: Home Location provider: Acupuncturist, Office Persons participating in the virtual visit: pt and myself  I discussed the limitations of evaluation and management by telemedicine and the availability of in person appointments. The patient expressed understanding and agreed to proceed.  Subjective:   HPI:  Possible COVID infxn- pt works at [employer] and has 2 known exposures.  Had body aches on Sunday.  On Monday (3 days ago) developed dry cough, chills, body aches.  Body aches are severe.  + low grade temps.  'chills are crazy'.  Denies HA.  No sore throat.  + fatigue.  + night sweats.  No SOB.  ROS: See pertinent positives and negatives per HPI.  Patient Active Problem List   Diagnosis Date Noted  . Vitamin D deficiency 09/03/2018  . Obstructive sleep apnea 12/20/2015  . Dysphagia 10/11/2015  . Snoring 08/18/2015  . Chest pressure 01/28/2015  . Lung nodule 09/16/2014  . Soft tissue mass 08/18/2014  . Obesity 07/22/2014  . Pulmonary nodules 07/22/2014  . Thrombocytosis ([place]) 06/02/2014  . Normocytic anemia 06/02/2014  . Anxiety and depression 03/31/2014  . Chronic fatigue 03/13/2014  . SOB (shortness of breath) 03/13/2014  . Birth control 05/07/2013  . HTN (hypertension) 12/02/2012  . Physical exam 08/22/2012    Social History   Tobacco Use  . Smoking status: Never Smoker  . Smokeless tobacco: Never Used  Substance Use Topics  . Alcohol use: Yes    Alcohol/week: 0.0 standard drinks    Comment: occasionally wine 1-2 glasses q2 weeks    Current Outpatient Medications:  .  amLODipine (NORVASC) 5 MG tablet, TAKE 1 TABLET BY MOUTH DAILY, Disp: 90 tablet, Rfl: 1 .  cetirizine (ZYRTEC) 10 MG tablet, Take 1 tablet (10 mg total) by mouth daily.,  Disp: 30 tablet, Rfl: 11 .  dicyclomine (BENTYL) 20 MG tablet, TAKE 1 TABLET (20 MG TOTAL) BY MOUTH 4 (FOUR) TIMES DAILY - BEFORE MEALS AND AT BEDTIME., Disp: 60 tablet, Rfl: 1 .  FLUoxetine (PROZAC) 20 MG capsule, TAKE 1 CAPSULE BY MOUTH EVERY DAY, Disp: 90 capsule, Rfl: 1 .  fluticasone (FLONASE) 50 MCG/ACT nasal spray, Place 2 sprays into both nostrils daily., Disp: 16 g, Rfl: 1 .  hydrochlorothiazide (HYDRODIURIL) 25 MG tablet, TAKE 1 TABLET (25 MG TOTAL) BY MOUTH DAILY., Disp: 90 tablet, Rfl: 1 .  norgestimate-ethinyl estradiol (SPRINTEC 28) 0.25-35 MG-MCG tablet, TAKE 1 TABLET BY MOUTH DAILY. SKIP PLACEBO PILLS AND GO RIGHT INTO NEXT PACK, Disp: 112 tablet, Rfl: 0  No Known Allergies  Objective:   BP (!) 164/85   Temp 98.6 F (37 C)   Ht 5\' 4"  (1.626 m)   Wt 220 lb (99.8 kg)   BMI 37.76 kg/m   AAOx3, NAD, obviously not feeling well, wrapped in blankets NCAT, EOMI No obvious CN deficits Coloring WNL Pt is able to speak clearly, coherently without shortness of breath or increased work of breathing.  Thought process is linear.  Mood is appropriate.   Assessment and Plan:   Suspected COVID- new.  Pt's sxs and hx of exposure are concerning for COVID infxn.  She is not currently short of breath nor having vomiting/diarrhea.  Reviewed need to stay home and self quarantine.  Discussed that if she has shortness of breath,  intractable vomiting or diarrhea, she needs to go to [facility] ER.  Pt expressed understanding and is in agreement w/ plan.   [name], MD 03/19/2019

## 2019-03-21 ENCOUNTER — Other Ambulatory Visit: Payer: Self-pay

## 2019-03-21 ENCOUNTER — Telehealth: Payer: Self-pay | Admitting: *Deleted

## 2019-03-21 ENCOUNTER — Encounter (INDEPENDENT_AMBULATORY_CARE_PROVIDER_SITE_OTHER): Payer: Self-pay

## 2019-03-21 ENCOUNTER — Encounter: Payer: Self-pay | Admitting: Family Medicine

## 2019-03-21 ENCOUNTER — Ambulatory Visit (INDEPENDENT_AMBULATORY_CARE_PROVIDER_SITE_OTHER): Payer: 59 | Admitting: Family Medicine

## 2019-03-21 VITALS — BP 174/90 | Temp 98.6°F | Ht 64.0 in | Wt 219.0 lb

## 2019-03-21 DIAGNOSIS — Z20822 Contact with and (suspected) exposure to covid-19: Secondary | ICD-10-CM

## 2019-03-21 DIAGNOSIS — R6889 Other general symptoms and signs: Secondary | ICD-10-CM

## 2019-03-21 DIAGNOSIS — Z20828 Contact with and (suspected) exposure to other viral communicable diseases: Secondary | ICD-10-CM | POA: Diagnosis not present

## 2019-03-21 NOTE — Telephone Encounter (Signed)
FYI

## 2019-03-21 NOTE — Progress Notes (Signed)
I have discussed the procedure for the virtual visit with the patient who has given consent to proceed with assessment and treatment.   BETHANY DILLARD, CMA     

## 2019-03-21 NOTE — Telephone Encounter (Signed)
Returned call to patient after receiving BPA of worsening cough on COVID-19 Home Monitoring Questionnaire. Pt states she had a virtual visit with PCP at 1300 on today. Pt states she was experiencing more of a film with the cough now where as before it was more of a dry cough. Pt states that she did inform PCP of this as well. Pt states she filled out questionnaire today after visit with PCP. PCP aware of worsening symptoms and not additional symptoms voiced at this time. Will route to provider as FYI.

## 2019-03-21 NOTE — Progress Notes (Signed)
Virtual Visit via Video   I connected with patient on 03/21/19 at  1:00 PM EDT by a video enabled telemedicine application and verified that I am speaking with the correct person using two identifiers.  Location patient: Home Location provider: [facility], Office Persons participating in the virtual visit: Patient, Provider, [place] ([place])  I discussed the limitations of evaluation and management by telemedicine and the availability of in person appointments. The patient expressed understanding and agreed to proceed.  Subjective:   HPI:   Suspected COVID- pt was seen 4/15 and dx'd w/ likely COVID.  Pt reports feeling better during the day but 'the sxs ramp up at night'.  Waking up w/ 'shirt soaked'.  Cough has worsened- now wet and intermittently productive.  + fatigue, anorexia.  Drinking fluids.  Body aches are better during the day but still bad at night.  No SOB w/ exertion but chest feels tight and at times difficult to take a deep breath.  No radiation to arm or jaw.  No N/V/D.  Elevated BP- pt has hx of HTN but typically well controlled on Amlodipine and HCTZ.  Pt is very anxious about the situation and feels BP may be elevated due to stress.  Denies HA or blurry vision  ROS:   See pertinent positives and negatives per HPI.  Patient Active Problem List   Diagnosis Date Noted  . Vitamin D deficiency 09/03/2018  . Obstructive sleep apnea 12/20/2015  . Dysphagia 10/11/2015  . Snoring 08/18/2015  . Chest pressure 01/28/2015  . Lung nodule 09/16/2014  . Soft tissue mass 08/18/2014  . Obesity 07/22/2014  . Pulmonary nodules 07/22/2014  . Thrombocytosis ([place]) 06/02/2014  . Normocytic anemia 06/02/2014  . Anxiety and depression 03/31/2014  . Chronic fatigue 03/13/2014  . SOB (shortness of breath) 03/13/2014  . Birth control 05/07/2013  . HTN (hypertension) 12/02/2012  . Physical exam 08/22/2012    Social History   Tobacco Use  . Smoking status: Never  Smoker  . Smokeless tobacco: Never Used  Substance Use Topics  . Alcohol use: Yes    Alcohol/week: 0.0 standard drinks    Comment: occasionally wine 1-2 glasses q2 weeks    Current Outpatient Medications:  .  amLODipine (NORVASC) 5 MG tablet, TAKE 1 TABLET BY MOUTH DAILY, Disp: 90 tablet, Rfl: 1 .  cetirizine (ZYRTEC) 10 MG tablet, Take 1 tablet (10 mg total) by mouth daily., Disp: 30 tablet, Rfl: 11 .  dicyclomine (BENTYL) 20 MG tablet, TAKE 1 TABLET (20 MG TOTAL) BY MOUTH 4 (FOUR) TIMES DAILY - BEFORE MEALS AND AT BEDTIME., Disp: 60 tablet, Rfl: 1 .  FLUoxetine (PROZAC) 20 MG capsule, TAKE 1 CAPSULE BY MOUTH EVERY DAY, Disp: 90 capsule, Rfl: 1 .  fluticasone (FLONASE) 50 MCG/ACT nasal spray, Place 2 sprays into both nostrils daily., Disp: 16 g, Rfl: 1 .  hydrochlorothiazide (HYDRODIURIL) 25 MG tablet, TAKE 1 TABLET (25 MG TOTAL) BY MOUTH DAILY., Disp: 90 tablet, Rfl: 1 .  norgestimate-ethinyl estradiol (SPRINTEC 28) 0.25-35 MG-MCG tablet, TAKE 1 TABLET BY MOUTH DAILY. SKIP PLACEBO PILLS AND GO RIGHT INTO NEXT PACK, Disp: 112 tablet, Rfl: 0  No Known Allergies  Objective:   BP (!) 174/90   Temp 98.6 F (37 C)   Ht 5\' 4"  (1.626 m)   Wt 219 lb (99.3 kg)   BMI 37.59 kg/m   AAOx3, NAD NCAT, EOMI No obvious CN deficits Coloring WNL Pt is able to speak clearly, coherently without shortness of breath  or increased work of breathing.  Thought process is linear.  Mood is appropriate.   Assessment and Plan:   Suspected COVID 19- known exposure, sxs started 6 days ago w/ body aches.  Continues to have nightly fevers and body aches.  Cough is now wet and more productive.  Denies SOB but does note chest tightness and at times difficulty taking a deep breath.  She feels this is anxiety related.  She has some left over Alprazolam from 3 yrs ago and is going to take one to see if this eases her chest tightness.  If so, will send in new short term script.  If not, will need to carefully monitor  chest tightness.  HTN- Deteriorated.  pt feels this is due to physical and emotional stress of illness.  Typically well controlled.  Able to check BP at home and she will keep watch in this over the weekend.  Cautioned her that if BP continues to climb or she develops CP, SOB, severe HA or other concerning symptoms she needed to go to ER.  Pt expressed understanding and is in agreement w/ plan.   [name], MD 03/21/2019

## 2019-03-22 ENCOUNTER — Encounter (INDEPENDENT_AMBULATORY_CARE_PROVIDER_SITE_OTHER): Payer: Self-pay

## 2019-03-23 ENCOUNTER — Encounter (INDEPENDENT_AMBULATORY_CARE_PROVIDER_SITE_OTHER): Payer: Self-pay

## 2019-03-24 ENCOUNTER — Encounter (INDEPENDENT_AMBULATORY_CARE_PROVIDER_SITE_OTHER): Payer: Self-pay

## 2019-03-25 ENCOUNTER — Encounter (INDEPENDENT_AMBULATORY_CARE_PROVIDER_SITE_OTHER): Payer: Self-pay

## 2019-03-25 ENCOUNTER — Encounter: Payer: Self-pay | Admitting: Family Medicine

## 2019-03-26 ENCOUNTER — Encounter (INDEPENDENT_AMBULATORY_CARE_PROVIDER_SITE_OTHER): Payer: Self-pay

## 2019-03-27 ENCOUNTER — Encounter (INDEPENDENT_AMBULATORY_CARE_PROVIDER_SITE_OTHER): Payer: Self-pay

## 2019-03-28 ENCOUNTER — Encounter: Payer: Self-pay | Admitting: Family Medicine

## 2019-03-28 ENCOUNTER — Encounter (INDEPENDENT_AMBULATORY_CARE_PROVIDER_SITE_OTHER): Payer: Self-pay

## 2019-03-29 ENCOUNTER — Encounter (INDEPENDENT_AMBULATORY_CARE_PROVIDER_SITE_OTHER): Payer: Self-pay

## 2019-03-30 ENCOUNTER — Encounter (INDEPENDENT_AMBULATORY_CARE_PROVIDER_SITE_OTHER): Payer: Self-pay

## 2019-03-31 ENCOUNTER — Encounter (INDEPENDENT_AMBULATORY_CARE_PROVIDER_SITE_OTHER): Payer: Self-pay

## 2019-04-01 ENCOUNTER — Encounter (INDEPENDENT_AMBULATORY_CARE_PROVIDER_SITE_OTHER): Payer: Self-pay

## 2019-04-01 ENCOUNTER — Encounter: Payer: Self-pay | Admitting: Family Medicine

## 2019-04-02 ENCOUNTER — Encounter (INDEPENDENT_AMBULATORY_CARE_PROVIDER_SITE_OTHER): Payer: Self-pay

## 2019-04-03 ENCOUNTER — Other Ambulatory Visit: Payer: Self-pay | Admitting: Family Medicine

## 2019-04-03 ENCOUNTER — Encounter (INDEPENDENT_AMBULATORY_CARE_PROVIDER_SITE_OTHER): Payer: Self-pay

## 2019-05-20 ENCOUNTER — Other Ambulatory Visit: Payer: Self-pay | Admitting: Family Medicine

## 2019-05-20 MED ORDER — NORGESTIMATE-ETH ESTRADIOL 0.25-35 MG-MCG PO TABS: ORAL_TABLET | ORAL | 0 refills | Status: DC

## 2019-06-30 ENCOUNTER — Other Ambulatory Visit: Payer: Self-pay | Admitting: Family Medicine

## 2019-08-07 ENCOUNTER — Other Ambulatory Visit: Payer: Self-pay | Admitting: Family Medicine

## 2019-08-08 ENCOUNTER — Other Ambulatory Visit: Payer: Self-pay | Admitting: Family Medicine

## 2019-08-08 MED ORDER — NORGESTIMATE-ETH ESTRADIOL 0.25-35 MG-MCG PO TABS: ORAL_TABLET | ORAL | 0 refills | Status: DC

## 2019-09-26 ENCOUNTER — Other Ambulatory Visit: Payer: Self-pay | Admitting: Family Medicine

## 2019-10-23 ENCOUNTER — Other Ambulatory Visit: Payer: Self-pay | Admitting: Family Medicine

## 2019-11-03 ENCOUNTER — Other Ambulatory Visit: Payer: Self-pay | Admitting: Family Medicine

## 2019-11-03 MED ORDER — NORGESTIMATE-ETH ESTRADIOL 0.25-35 MG-MCG PO TABS: ORAL_TABLET | ORAL | 0 refills | Status: DC

## 2019-11-17 ENCOUNTER — Other Ambulatory Visit: Payer: Self-pay | Admitting: Family Medicine

## 2019-11-27 ENCOUNTER — Ambulatory Visit (INDEPENDENT_AMBULATORY_CARE_PROVIDER_SITE_OTHER): Payer: 59 | Admitting: Family Medicine

## 2019-11-27 ENCOUNTER — Other Ambulatory Visit: Payer: Self-pay

## 2019-11-27 ENCOUNTER — Encounter: Payer: Self-pay | Admitting: Family Medicine

## 2019-11-27 DIAGNOSIS — I10 Essential (primary) hypertension: Secondary | ICD-10-CM | POA: Diagnosis not present

## 2019-11-27 DIAGNOSIS — Z20828 Contact with and (suspected) exposure to other viral communicable diseases: Secondary | ICD-10-CM | POA: Diagnosis not present

## 2019-11-27 DIAGNOSIS — E559 Vitamin D deficiency, unspecified: Secondary | ICD-10-CM | POA: Diagnosis not present

## 2019-11-27 DIAGNOSIS — Z0000 Encounter for general adult medical examination without abnormal findings: Secondary | ICD-10-CM | POA: Diagnosis not present

## 2019-11-27 DIAGNOSIS — Z20822 Contact with and (suspected) exposure to covid-19: Secondary | ICD-10-CM

## 2019-11-27 MED ORDER — ALBUTEROL SULFATE HFA 108 (90 BASE) MCG/ACT IN AERS: 2.0000 | INHALATION_SPRAY | Freq: Four times a day (QID) | RESPIRATORY_TRACT | 1 refills | Status: DC | PRN

## 2019-11-27 NOTE — Progress Notes (Signed)
I have discussed the procedure for the virtual visit with the patient who has given consent to proceed with assessment and treatment.   Pt unable to obtain vitals.   Maxx Pham L Wilkin Lippy, CMA     

## 2019-11-27 NOTE — Progress Notes (Signed)
Virtual Visit via Video   I connected with patient on 11/27/19 at  9:00 AM EST by a video enabled telemedicine application and verified that I am speaking with the correct person using two identifiers.  Location patient: Home Location provider: [facility], Office Persons participating in the virtual visit: Patient, Provider, [place] ([place])  I discussed the limitations of evaluation and management by telemedicine and the availability of in person appointments. The patient expressed understanding and agreed to proceed.  Subjective:   HPI:   CPE- due for pap, mammo (Dr Valentino Saxon).  UTD on Tdap.  Suspected COVID- sxs started yesterday w/ chest tightness and fever.  + cough- dry cough.  + nasal congestion.  + back pain.  + fatigue.    ROS:  Patient reports no vision/ hearing changes, adenopathy,fever, weight change,  persistant/recurrent hoarseness , swallowing issues, chest pain, palpitations, edema, persistant/recurrent cough, hemoptysis, dyspnea (rest/exertional/paroxysmal nocturnal), gastrointestinal bleeding (melena, rectal bleeding), abdominal pain, significant heartburn, bowel changes, GU symptoms (dysuria, hematuria, incontinence), Gyn symptoms (abnormal  bleeding, pain),  syncope, focal weakness, memory loss, numbness & tingling, skin/hair/nail changes, abnormal bruising or bleeding, anxiety, or depression.   Patient Active Problem List   Diagnosis Date Noted  . Vitamin D deficiency 09/03/2018  . Obstructive sleep apnea 12/20/2015  . Dysphagia 10/11/2015  . Snoring 08/18/2015  . Chest pressure 01/28/2015  . Lung nodule 09/16/2014  . Soft tissue mass 08/18/2014  . Obesity 07/22/2014  . Pulmonary nodules 07/22/2014  . Thrombocytosis ([place]) 06/02/2014  . Normocytic anemia 06/02/2014  . Anxiety and depression 03/31/2014  . Chronic fatigue 03/13/2014  . SOB (shortness of breath) 03/13/2014  . Birth control 05/07/2013  . HTN (hypertension) 12/02/2012  . Physical exam  08/22/2012    Social History   Tobacco Use  . Smoking status: Never Smoker  . Smokeless tobacco: Never Used  Substance Use Topics  . Alcohol use: Yes    Alcohol/week: 0.0 standard drinks    Comment: occasionally wine 1-2 glasses q2 weeks    Current Outpatient Medications:  .  amLODipine (NORVASC) 5 MG tablet, TAKE 1 TABLET (5 MG TOTAL) BY MOUTH DAILY. PLEASE SCHEDULE YOUR YEARLY PHYSICAL WITH DR. Emira Eubanks FOR FURTHER REFILLS [phone], Disp: 30 tablet, Rfl: 1 .  cetirizine (ZYRTEC) 10 MG tablet, Take 1 tablet (10 mg total) by mouth daily., Disp: 30 tablet, Rfl: 11 .  dicyclomine (BENTYL) 20 MG tablet, TAKE 1 TABLET (20 MG TOTAL) BY MOUTH 4 (FOUR) TIMES DAILY - BEFORE MEALS AND AT BEDTIME., Disp: 60 tablet, Rfl: 1 .  FLUoxetine (PROZAC) 20 MG capsule, TAKE 1 CAPSULE BY MOUTH EVERY DAY, Disp: 90 capsule, Rfl: 1 .  fluticasone (FLONASE) 50 MCG/ACT nasal spray, Place 2 sprays into both nostrils daily., Disp: 16 g, Rfl: 1 .  hydrochlorothiazide (HYDRODIURIL) 25 MG tablet, TAKE 1 TABLET BY MOUTH EVERY DAY, Disp: 90 tablet, Rfl: 1 .  norgestimate-ethinyl estradiol (SPRINTEC 28) 0.25-35 MG-MCG tablet, TAKE 1 TABLET BY MOUTH DAILY. SKIP PLACEBO PILLS AND GO RIGHT INTO NEXT PACK, Disp: 112 tablet, Rfl: 0  No Known Allergies  Objective:   There were no vitals taken for this visit. AAOx3, NAD Obese NCAT, EOMI No obvious CN deficits Coloring WNL Pt is able to speak clearly, coherently without shortness of breath or increased work of breathing.  Thought process is linear.  Mood is appropriate.   Assessment and Plan:   CPE- pt's limited video PE WNL w/ exception of obesity.  She is going to call GYN to  schedule pap and mammo.  UTD on immunizations.  Check labs once she is out of COVID quarantine.  Anticipatory guidance provided.   COVID- pt's sxs are highly suspicious for COVID and she works in a pharmacy.  Given her chest tightness and SOB will start albuterol inhaler.  Reviewed supportive  care and red flags that should prompt return.  Pt expressed understanding and is in agreement w/ plan.    [name], MD 11/27/2019

## 2019-11-28 ENCOUNTER — Encounter (INDEPENDENT_AMBULATORY_CARE_PROVIDER_SITE_OTHER): Payer: Self-pay

## 2019-11-29 ENCOUNTER — Encounter (INDEPENDENT_AMBULATORY_CARE_PROVIDER_SITE_OTHER): Payer: Self-pay

## 2019-11-30 ENCOUNTER — Encounter (INDEPENDENT_AMBULATORY_CARE_PROVIDER_SITE_OTHER): Payer: Self-pay

## 2019-11-30 ENCOUNTER — Telehealth: Payer: Self-pay

## 2019-11-30 NOTE — Telephone Encounter (Signed)
Called pt and LM on VM to call back to discuss new weakness and worsening appetite.

## 2019-11-30 NOTE — Telephone Encounter (Signed)
Pt very little energy and "I have slept all day." Has lost sense of taste. Pt has eaten grapes only. Is drinking half a bottle of Gatorade only. Last void 1 hour ago, dark yellow. Pt denies nausea, dizziness or vomiting.  Advised pt that if appetite becomes worse to drink fluids as tolerated and work her way to bland solid foods such as crackers, pretzels, soup, bread or applesauce and boiled starches. Pt advised if she is not able to tolerate any foods or liquids, to call PCP. Pt advised if she develops severe vomiting (more than 6 times a day and/or >8 hours) and/or severe abdominal pain to call 911 and seek treatment in the ED. For weakness,, pt advised if weakness is worsening with inability to stand or if she has to hold on to something to get balance, to call 911 and seek tx in the ED. Pt verbalized understanding.

## 2019-12-01 ENCOUNTER — Encounter (INDEPENDENT_AMBULATORY_CARE_PROVIDER_SITE_OTHER): Payer: Self-pay

## 2019-12-02 ENCOUNTER — Encounter (INDEPENDENT_AMBULATORY_CARE_PROVIDER_SITE_OTHER): Payer: Self-pay

## 2019-12-03 ENCOUNTER — Encounter (INDEPENDENT_AMBULATORY_CARE_PROVIDER_SITE_OTHER): Payer: Self-pay

## 2019-12-03 ENCOUNTER — Encounter: Payer: Self-pay | Admitting: Family Medicine

## 2019-12-04 ENCOUNTER — Encounter (INDEPENDENT_AMBULATORY_CARE_PROVIDER_SITE_OTHER): Payer: Self-pay

## 2019-12-05 ENCOUNTER — Encounter (INDEPENDENT_AMBULATORY_CARE_PROVIDER_SITE_OTHER): Payer: Self-pay

## 2019-12-06 ENCOUNTER — Emergency Department (HOSPITAL_COMMUNITY)
Admission: EM | Admit: 2019-12-06 | Discharge: 2019-12-06 | Disposition: A | Discharge status: Home / Self Care | Payer: 59 | Attending: Emergency Medicine | Admitting: Emergency Medicine

## 2019-12-06 ENCOUNTER — Emergency Department (HOSPITAL_COMMUNITY): Payer: 59

## 2019-12-06 ENCOUNTER — Encounter (INDEPENDENT_AMBULATORY_CARE_PROVIDER_SITE_OTHER): Payer: Self-pay

## 2019-12-06 ENCOUNTER — Encounter (HOSPITAL_COMMUNITY): Payer: Self-pay

## 2019-12-06 ENCOUNTER — Other Ambulatory Visit: Payer: Self-pay

## 2019-12-06 ENCOUNTER — Telehealth: Payer: Self-pay

## 2019-12-06 DIAGNOSIS — U071 COVID-19: Secondary | ICD-10-CM

## 2019-12-06 DIAGNOSIS — I10 Essential (primary) hypertension: Secondary | ICD-10-CM | POA: Insufficient documentation

## 2019-12-06 DIAGNOSIS — R7401 Elevation of levels of liver transaminase levels: Secondary | ICD-10-CM | POA: Diagnosis not present

## 2019-12-06 DIAGNOSIS — E860 Dehydration: Secondary | ICD-10-CM | POA: Insufficient documentation

## 2019-12-06 DIAGNOSIS — Z79899 Other long term (current) drug therapy: Secondary | ICD-10-CM | POA: Diagnosis not present

## 2019-12-06 DIAGNOSIS — R0602 Shortness of breath: Secondary | ICD-10-CM | POA: Diagnosis present

## 2019-12-06 DIAGNOSIS — R110 Nausea: Secondary | ICD-10-CM

## 2019-12-06 LAB — I-STAT BETA HCG BLOOD, ED (MC, WL, AP ONLY): I-stat hCG, quantitative: <5 m[IU]/mL (ref <5)

## 2019-12-06 LAB — COMPREHENSIVE METABOLIC PANEL
ALT: 158 U/L — ABNORMAL HIGH (ref 0–44)
AST: 121 U/L — ABNORMAL HIGH (ref 15–41)
Albumin: 3.8 g/dL (ref 3.5–5.0)
Alkaline Phosphatase: 229 U/L — ABNORMAL HIGH (ref 38–126)
Anion gap: 12 (ref 5–15)
BUN: 10 mg/dL (ref 6–20)
CO2: 26 mmol/L (ref 22–32)
Calcium: 8.7 mg/dL — ABNORMAL LOW (ref 8.9–10.3)
Chloride: 100 mmol/L (ref 98–111)
Creatinine, Ser: 1.04 mg/dL — ABNORMAL HIGH (ref 0.44–1.00)
GFR calc Af Amer: >60 mL/min (ref >60)
GFR calc non Af Amer: >60 mL/min (ref >60)
Glucose, Bld: 108 mg/dL — ABNORMAL HIGH (ref 70–99)
Potassium: 3.6 mmol/L (ref 3.5–5.1)
Sodium: 138 mmol/L (ref 135–145)
Total Bilirubin: 2 mg/dL — ABNORMAL HIGH (ref 0.3–1.2)
Total Protein: 8.2 g/dL — ABNORMAL HIGH (ref 6.5–8.1)

## 2019-12-06 LAB — CBC WITH DIFFERENTIAL/PLATELET
Abs Immature Granulocytes: 0.03 10*3/uL (ref 0.00–0.07)
Basophils Absolute: 0 10*3/uL (ref 0.0–0.1)
Basophils Relative: 0 %
Eosinophils Absolute: 0 10*3/uL (ref 0.0–0.5)
Eosinophils Relative: 0 %
HCT: 44.5 % (ref 36.0–46.0)
Hemoglobin: 13.9 g/dL (ref 12.0–15.0)
Immature Granulocytes: 1 %
Lymphocytes Relative: 30 %
Lymphs Abs: 1.7 10*3/uL (ref 0.7–4.0)
MCH: 27.5 pg (ref 26.0–34.0)
MCHC: 31.2 g/dL (ref 30.0–36.0)
MCV: 87.9 fL (ref 80.0–100.0)
Monocytes Absolute: 0.6 10*3/uL (ref 0.1–1.0)
Monocytes Relative: 10 %
Neutro Abs: 3.3 10*3/uL (ref 1.7–7.7)
Neutrophils Relative %: 59 %
Platelets: 407 10*3/uL — ABNORMAL HIGH (ref 150–400)
RBC: 5.06 MIL/uL (ref 3.87–5.11)
RDW: 12.2 % (ref 11.5–15.5)
WBC: 5.5 10*3/uL (ref 4.0–10.5)
nRBC: 0 % (ref 0.0–0.2)

## 2019-12-06 LAB — LIPASE, BLOOD: Lipase: 27 U/L (ref 11–51)

## 2019-12-06 MED ORDER — SODIUM CHLORIDE 0.9 % IV BOLUS
1000.0000 mL | Freq: Once | INTRAVENOUS | Status: AC
  Administered 2019-12-06: 1000 mL via INTRAVENOUS

## 2019-12-06 MED ORDER — ONDANSETRON 4 MG PO TBDP
4.0000 mg | ORAL_TABLET | Freq: Once | ORAL | Status: AC
  Administered 2019-12-06: 4 mg via ORAL
  Filled 2019-12-06: qty 1

## 2019-12-06 MED ORDER — ONDANSETRON 4 MG PO TBDP: 4.0000 mg | ORAL_TABLET | Freq: Four times a day (QID) | ORAL | 0 refills | Status: DC | PRN

## 2019-12-06 NOTE — ED Provider Notes (Signed)
[facility] DEPT Provider Note   CSN: [id number] Arrival date & time: 12/06/19  1511     History Chief Complaint  Patient presents with  . COVID POSITIVE    SOB    Pamela Clarke is a 44 y.o. female with a past medical history of anxiety, depression, hypertension, who presents today for evaluation of cough, chest pain and nausea in the setting of Covid.  She tested positive on 12/24.  She reports that over the course of her illness she has had significant difficulty maintaining adequate p.o. intake.  She states that she is very nauseous and has poor appetite.  She denies any significant vomiting or diarrhea.  She states that she is not urinating frequently and her urine is dark-colored.  She reports that she has only been able to drink one bottle of fluids today.  She reports that she is continuing to have fevers at home.  She took 2 extra strength Tylenol about 2 hours prior to arrival.  She denies significant shortness of breath, states that she is still able to ambulate at home without difficulty.  She reports that her chest pain is made worse with movement.  Her chest pain has not moved or radiated.  She denies any personal cardiac history.  She denies any leg swelling or hemoptysis. HPI     Past Medical History:  Diagnosis Date  . Anxiety   . Arthritis   . Axillary lymphadenopathy 05/18/2015  . Blood in stool   . Depression   . Fatty liver   . Heart murmur   . Helicobacter pylori gastritis   . Hyperplastic colon polyp 10/22/12  . Hypertension    history of this, no meds  . Hypokalemia 06/02/2014  . Lung nodule 09/16/2014  . Obesity   . Thrombocytosis ([place]) 06/02/2014  . Unspecified deficiency anemia 06/02/2014    Patient Active Problem List   Diagnosis Date Noted  . Vitamin D deficiency 09/03/2018  . Obstructive sleep apnea 12/20/2015  . Dysphagia 10/11/2015  . Snoring 08/18/2015  . Lung nodule 09/16/2014  . Soft tissue mass  08/18/2014  . Obesity 07/22/2014  . Pulmonary nodules 07/22/2014  . Thrombocytosis ([place]) 06/02/2014  . Normocytic anemia 06/02/2014  . Anxiety and depression 03/31/2014  . SOB (shortness of breath) 03/13/2014  . Birth control 05/07/2013  . HTN (hypertension) 12/02/2012  . Physical exam 08/22/2012    Past Surgical History:  Procedure Laterality Date  . BREAST BIOPSY Left   . BREAST REDUCTION SURGERY  10/19/08  . KNEE ARTHROSCOPY     left  . KNEE SURGERY  3 23 13    right  . REDUCTION MAMMAPLASTY Bilateral      OB History   No obstetric history on file.     Family History  Problem Relation Age of Onset  . Stroke Father   . Hypertension Father   . Diabetes Father   . Heart failure Father   . Hypertension Maternal Grandmother   . Stroke Maternal Grandmother   . Heart disease Maternal Grandmother   . Ovarian cancer Paternal Grandmother   . Heart disease Paternal Grandfather   . Stroke Paternal Grandfather   . Sickle cell anemia Cousin   . Stomach cancer Cousin        1st cousin  . Breast cancer Paternal Aunt        86's  . Colon cancer Neg Hx     Social History   Tobacco Use  . Smoking status: Never  Smoker  . Smokeless tobacco: Never Used  Substance Use Topics  . Alcohol use: Yes    Alcohol/week: 0.0 standard drinks    Comment: occasionally wine 1-2 glasses q2 weeks  . Drug use: No    Home Medications Prior to Admission medications   Medication Sig Start Date End Date Taking? Authorizing Provider  albuterol (VENTOLIN HFA) 108 (90 Base) MCG/ACT inhaler Inhale 2 puffs into the lungs every 6 (six) hours as needed for wheezing or shortness of breath. 11/27/19   [name], MD  amLODipine (NORVASC) 5 MG tablet TAKE 1 TABLET (5 MG TOTAL) BY MOUTH DAILY. PLEASE SCHEDULE YOUR YEARLY PHYSICAL WITH DR. TABORI FOR FURTHER REFILLS [phone] 10/23/19   [name], MD  cetirizine (ZYRTEC) 10 MG tablet Take 1 tablet (10 mg total) by mouth daily.  07/22/15   [name], MD  dicyclomine (BENTYL) 20 MG tablet TAKE 1 TABLET (20 MG TOTAL) BY MOUTH 4 (FOUR) TIMES DAILY - BEFORE MEALS AND AT BEDTIME. 01/07/18   [name], MD  FLUoxetine (PROZAC) 20 MG capsule TAKE 1 CAPSULE BY MOUTH EVERY DAY 06/30/19   [name], MD  fluticasone (FLONASE) 50 MCG/ACT nasal spray Place 2 sprays into both nostrils daily. 11/01/17   Brunetta Jeans, PA-C  hydrochlorothiazide (HYDRODIURIL) 25 MG tablet TAKE 1 TABLET BY MOUTH EVERY DAY 06/30/19   [name], MD  norgestimate-ethinyl estradiol (SPRINTEC 28) 0.25-35 MG-MCG tablet TAKE 1 TABLET BY MOUTH DAILY. SKIP PLACEBO PILLS AND GO RIGHT INTO NEXT PACK 11/03/19   [name], MD  ondansetron (ZOFRAN ODT) 4 MG disintegrating tablet Take 1 tablet (4 mg total) by mouth every 6 (six) hours as needed for nausea or vomiting. 12/06/19   Lorin Glass, PA-C    Allergies    Patient has no known allergies.  Review of Systems   Review of Systems  Constitutional: Positive for chills, fatigue and fever.  HENT: Negative for congestion.   Eyes: Negative for visual disturbance.  Respiratory: Positive for cough. Negative for shortness of breath.   Cardiovascular: Positive for chest pain. Negative for palpitations and leg swelling.  Gastrointestinal: Positive for nausea. Negative for abdominal pain, constipation, diarrhea and vomiting.  Genitourinary: Positive for decreased urine volume.  Musculoskeletal: Positive for myalgias. Negative for back pain and neck pain.  Skin: Negative for rash and wound.  Neurological: Positive for weakness (Generalized). Negative for headaches.  Psychiatric/Behavioral: Negative for confusion.  All other systems reviewed and are negative.   Physical Exam Updated Vital Signs BP (!) 125/95   Pulse (!) 102   Temp 98.6 F (37 C) (Oral)   Resp 18   SpO2 99%   Physical Exam Vitals and nursing note reviewed.  Constitutional:      Appearance:  She is well-developed.     Comments: Appears to feel unwell  HENT:     Head: Normocephalic and atraumatic.     Mouth/Throat:     Mouth: Mucous membranes are dry.  Eyes:     Conjunctiva/sclera: Conjunctivae normal.  Cardiovascular:     Rate and Rhythm: Regular rhythm. Tachycardia present.     Pulses: Normal pulses.     Heart sounds: Normal heart sounds. No murmur.  Pulmonary:     Effort: Pulmonary effort is normal. No respiratory distress.     Breath sounds: Normal breath sounds.  Chest:     Comments: Mild TTP over super sternocostal joints, however unable to fully recreate her pain with palpation.  Abdominal:     Palpations: Abdomen is soft.     Tenderness: There is no abdominal tenderness.  Musculoskeletal:     Cervical back: Normal range of motion and neck supple. No rigidity.     Right lower leg: No edema.     Left lower leg: No edema.  Skin:    General: Skin is warm and dry.  Neurological:     General: No focal deficit present.     Mental Status: She is alert and oriented to person, place, and time.  Psychiatric:        Mood and Affect: Mood normal.        Behavior: Behavior normal.     ED Results / Procedures / Treatments   Labs (all labs ordered are listed, but only abnormal results are displayed) Labs Reviewed  COMPREHENSIVE METABOLIC PANEL - Abnormal; Notable for the following components:      Result Value   Glucose, Bld 108 (*)    Creatinine, Ser 1.04 (*)    Calcium 8.7 (*)    Total Protein 8.2 (*)    AST 121 (*)    ALT 158 (*)    Alkaline Phosphatase 229 (*)    Total Bilirubin 2.0 (*)    All other components within normal limits  CBC WITH DIFFERENTIAL/PLATELET - Abnormal; Notable for the following components:   Platelets 407 (*)    All other components within normal limits  LIPASE, BLOOD  I-STAT BETA HCG BLOOD, ED ([facility], [facility], [facility] ONLY)    EKG EKG Interpretation  Date/Time:  Saturday December 06 2019 16:47:15 EST Ventricular Rate:  93 PR Interval:      QRS Duration: 68 QT Interval:  355 QTC Calculation: 442 R Axis:   37 Text Interpretation: Sinus rhythm Probable left atrial enlargement Borderline repolarization abnormality Confirmed by Quintella Reichert [phone]) on 12/06/2019 5:01:50 PM   Radiology DG Chest Port 1 View  Result Date: 12/06/2019 CLINICAL DATA:  Covid positive. New shortness of breath and chest tightness. EXAM: PORTABLE CHEST 1 VIEW COMPARISON:  Chest radiograph 12/27/2016 FINDINGS: The heart size and mediastinal contours are within normal limits. The lungs are clear. No pneumothorax or large pleural effusion. The visualized skeletal structures are unremarkable. IMPRESSION: No evidence of active disease in the chest. Electronically Signed   By: Audie Pinto M.D.   On: 12/06/2019 16:30    Procedures Procedures (including critical care time)  Medications Ordered in ED Medications  sodium chloride 0.9 % bolus 1,000 mL (0 mLs Intravenous Stopped 12/06/19 1738)  ondansetron (ZOFRAN-ODT) disintegrating tablet 4 mg (4 mg Oral Given 12/06/19 1742)    ED Course  I have reviewed the triage vital signs and the nursing notes.  Pertinent labs & imaging results that were available during my care of the patient were reviewed by me and considered in my medical decision making (see chart for details).  Clinical Course as of Dec 05 2344  Sat Dec 06, 2019  1835 Patient reports that she is feeling better after fluids.  She was able to pass p.o. challenge without difficulty and reports that the Zofran significantly improved her nausea.  We discussed possibility of Covid related blood clots, however she does not have leg swelling, significant shortness of breath or other signs to raise concern of blood clot as her primary concern was more nausea and poor p.o. intake.Discussed risks of untreated blood clots along with evaluation and work-up for these and she states her understanding.   [EH]    Clinical  Course User Index [EH] Ollen Gross   MDM Rules/Calculators/A&P                     Patient presents today for evaluation of poor p.o. intake, feeling generally weak and cough in the setting of coronavirus infection.  Her primary concern is her significant nausea vomiting her ability to maintain adequate fluid intake.  She reports decreased urination.  She is slightly tachycardic at 107 however she is afebrile raising concern for dehydration with dry mucous membranes.  She is afebrile not tachypneic, at 97% on room air and denies significant shortness of breath.  Chest x-ray was obtained showing no evidence of active disease.   EKG obtained without evidence of ischemia or cause for her chest pain.  I suspect that her chest pain is musculoskeletal in nature from coughing.  Labs are obtained and reviewed showing transaminitis, suspected Covid source given that she does not have significant abdominal pain.  CBC is unremarkable.  Creatinine is slightly elevated consistent with mild dehydration.    her dehydration was treated with 1 L IV fluids, after which she was able to urinate and reported feeling better.  She was treated with Zofran, after ensuring QTc interval is normal, and was able to p.o. challenge without difficulty.  She was able to ambulate in the room without desaturation.  Return precautions were discussed with patient who states their understanding.  At the time of discharge patient denied any unaddressed complaints or concerns.  Patient is agreeable for discharge home.  Note: Portions of this report may have been transcribed using voice recognition software. Every effort was made to ensure accuracy; however, inadvertent computerized transcription errors may be present  Elta Kazar was evaluated in Emergency Department on 12/06/2019 for the symptoms described in the history of present illness. She was evaluated in the context of the global COVID-19 pandemic, which necessitated consideration that the  patient might be at risk for infection with the SARS-CoV-2 virus that causes COVID-19. Institutional protocols and algorithms that pertain to the evaluation of patients at risk for COVID-19 are in a state of rapid change based on information released by regulatory bodies including the CDC and federal and state organizations. These policies and algorithms were followed during the patient's care in the ED.  Final Clinical Impression(s) / ED Diagnoses Final diagnoses:  COVID-19 virus infection  Dehydration  Transaminitis  Nausea    Rx / DC Orders ED Discharge Orders         Ordered    ondansetron (ZOFRAN ODT) 4 MG disintegrating tablet  Every 6 hours PRN     12/06/19 1837           Ollen Gross 12/06/19 2347    Quintella Reichert, MD 12/07/19 [phone]

## 2019-12-06 NOTE — ED Notes (Signed)
Pt provided cracker, ginger ale, and peanut butter. While ambulating around the room pt O2 sats remained above 98%

## 2019-12-06 NOTE — ED Notes (Signed)
Pt verbalizes understanding of DC instructions. Pt belongings returned and is ambulatory out of ED.  

## 2019-12-06 NOTE — Discharge Instructions (Signed)
Sometimes Zofran, your nausea medicine, can make people drowsy and sleepy.  Please do not drive, operate heavy machinery or perform other tasks that may be dangerous to you or someone else if this medicine were to make you sleepy.   As we discussed today your liver enzymes are elevated.  This is most likely due to coronavirus as this is a known correlation with Covid and a very common abnormal lab finding in patients with coronavirus.  It is important that you check all medications, including cough medicines, for Tylenol.  Please limit the amount of Tylenol that you are taking.  It would be better to take ibuprofen, 3 pills  (600mg ) every 8 hours as needed.  If you feel that you need to take Tylenol do not take more than 1 extra strength Tylenol every 8 hours or 2 regular strength Tylenols.  Once you are no longer contagious please schedule a follow-up appointment with your primary care doctor to get these numbers rechecked.  If you develop significant abdominal pain please return to the emergency room for additional evaluation.

## 2019-12-06 NOTE — ED Notes (Signed)
X-ray at bedside

## 2019-12-06 NOTE — ED Triage Notes (Signed)
Pt arrived POV ambulatory into ED with CC COVID POS 12/24 new onset SOB and chest tightness. VSS (tachy 107) afebrile took tylenol 2 hours ago for fever

## 2019-12-06 NOTE — Telephone Encounter (Signed)
Pt stated that her cough is becoming more congested and is having pain to her chest when she coughs. Advised pt that if cough remains the same or better to treat with OTC medication. Informed pt that if cough is productive do not take a cough suppressant. Advised pt to drink warm fluids and suck on hard candy or cough drops. Advised pt to try 2 tsp of hone at bedtime. Advised pt that if coughing up green or yellow secretions to seek care at [facility]. Advised pt that if cough is becoming worse even with the use of OTC medications, if cough is preventing pt from sleeping at night to call PCP.  For nausea, advised pt to continue to drink fluids as tolerated and work her way up to bland foods such as crackers, pretzels, soup,bread or applesauce and boiled starches. Advised pt to call 911 for severe vomiting (more that 6 times a day and/or >8 hours and/or severe abdominal pain. For weakness, advised pt that if weakness worsens to the point of not being able to stand or if she has to hold onto something to get balance, to call 911.  Pt verbalized understanding.

## 2019-12-07 ENCOUNTER — Encounter (INDEPENDENT_AMBULATORY_CARE_PROVIDER_SITE_OTHER): Payer: Self-pay

## 2019-12-08 ENCOUNTER — Encounter (INDEPENDENT_AMBULATORY_CARE_PROVIDER_SITE_OTHER): Payer: Self-pay

## 2019-12-09 ENCOUNTER — Encounter (INDEPENDENT_AMBULATORY_CARE_PROVIDER_SITE_OTHER): Payer: Self-pay

## 2019-12-17 ENCOUNTER — Other Ambulatory Visit: Payer: Self-pay

## 2019-12-17 ENCOUNTER — Ambulatory Visit (INDEPENDENT_AMBULATORY_CARE_PROVIDER_SITE_OTHER): Payer: 59 | Admitting: Emergency Medicine

## 2019-12-17 DIAGNOSIS — I10 Essential (primary) hypertension: Secondary | ICD-10-CM | POA: Diagnosis not present

## 2019-12-17 DIAGNOSIS — E559 Vitamin D deficiency, unspecified: Secondary | ICD-10-CM | POA: Diagnosis not present

## 2019-12-17 LAB — CBC WITH DIFFERENTIAL/PLATELET
Basophils Absolute: 0.1 10*3/uL (ref 0.0–0.1)
Basophils Relative: 0.6 % (ref 0.0–3.0)
Eosinophils Absolute: 0.1 10*3/uL (ref 0.0–0.7)
Eosinophils Relative: 0.8 % (ref 0.0–5.0)
HCT: 33.1 % — ABNORMAL LOW (ref 36.0–46.0)
Hemoglobin: 10.8 g/dL — ABNORMAL LOW (ref 12.0–15.0)
Lymphocytes Relative: 22.9 % (ref 12.0–46.0)
Lymphs Abs: 2.1 10*3/uL (ref 0.7–4.0)
MCHC: 32.7 g/dL (ref 30.0–36.0)
MCV: 86.2 fl (ref 78.0–100.0)
Monocytes Absolute: 0.9 10*3/uL (ref 0.1–1.0)
Monocytes Relative: 9.4 % (ref 3.0–12.0)
Neutro Abs: 6.1 10*3/uL (ref 1.4–7.7)
Neutrophils Relative %: 66.3 % (ref 43.0–77.0)
Platelets: 550 10*3/uL — ABNORMAL HIGH (ref 150.0–400.0)
RBC: 3.84 Mil/uL — ABNORMAL LOW (ref 3.87–5.11)
RDW: 12.4 % (ref 11.5–15.5)
WBC: 9.2 10*3/uL (ref 4.0–10.5)

## 2019-12-17 LAB — HEPATIC FUNCTION PANEL
ALT: 108 U/L — ABNORMAL HIGH (ref 0–35)
AST: 36 U/L (ref 0–37)
Albumin: 3.9 g/dL (ref 3.5–5.2)
Alkaline Phosphatase: 112 U/L (ref 39–117)
Bilirubin, Direct: 0.3 mg/dL (ref 0.0–0.3)
Total Bilirubin: 0.8 mg/dL (ref 0.2–1.2)
Total Protein: 6.9 g/dL (ref 6.0–8.3)

## 2019-12-17 LAB — BASIC METABOLIC PANEL
BUN: 13 mg/dL (ref 6–23)
CO2: 30 mEq/L (ref 19–32)
Calcium: 9.6 mg/dL (ref 8.4–10.5)
Chloride: 100 mEq/L (ref 96–112)
Creatinine, Ser: 0.9 mg/dL (ref 0.40–1.20)
GFR: 82.6 mL/min (ref >60.00)
Glucose, Bld: 93 mg/dL (ref 70–99)
Potassium: 3.7 mEq/L (ref 3.5–5.1)
Sodium: 139 mEq/L (ref 135–145)

## 2019-12-17 LAB — LIPID PANEL
Cholesterol: 110 mg/dL (ref 0–200)
HDL: 54 mg/dL (ref >39.00)
LDL Cholesterol: 32 mg/dL (ref 0–99)
NonHDL: 55.6
Total CHOL/HDL Ratio: 2
Triglycerides: 120 mg/dL (ref 0.0–149.0)
VLDL: 24 mg/dL (ref 0.0–40.0)

## 2019-12-17 LAB — TSH: TSH: 1.84 u[IU]/mL (ref 0.35–4.50)

## 2019-12-17 LAB — VITAMIN D 25 HYDROXY (VIT D DEFICIENCY, FRACTURES): VITD: 46.08 ng/mL (ref 30.00–100.00)

## 2019-12-18 ENCOUNTER — Other Ambulatory Visit: Payer: Self-pay | Admitting: Family Medicine

## 2019-12-18 DIAGNOSIS — D691 Qualitative platelet defects: Secondary | ICD-10-CM

## 2019-12-22 ENCOUNTER — Encounter: Payer: Self-pay | Admitting: Family Medicine

## 2019-12-22 MED ORDER — NORGESTIMATE-ETH ESTRADIOL 0.25-35 MG-MCG PO TABS: ORAL_TABLET | ORAL | 0 refills | Status: DC

## 2019-12-22 MED ORDER — AMLODIPINE BESYLATE 5 MG PO TABS: 5.0000 mg | ORAL_TABLET | Freq: Every day | ORAL | 0 refills | Status: DC

## 2019-12-26 ENCOUNTER — Other Ambulatory Visit: Payer: Self-pay | Admitting: Family Medicine

## 2020-01-23 ENCOUNTER — Other Ambulatory Visit: Payer: Self-pay | Admitting: Family Medicine

## 2020-01-23 MED ORDER — AMLODIPINE BESYLATE 5 MG PO TABS: 5.0000 mg | ORAL_TABLET | Freq: Every day | ORAL | 0 refills | Status: DC

## 2020-01-29 ENCOUNTER — Other Ambulatory Visit: Payer: Self-pay

## 2020-01-29 ENCOUNTER — Ambulatory Visit (INDEPENDENT_AMBULATORY_CARE_PROVIDER_SITE_OTHER): Payer: 59 | Admitting: *Deleted

## 2020-01-29 DIAGNOSIS — D691 Qualitative platelet defects: Secondary | ICD-10-CM

## 2020-01-29 LAB — CBC WITH DIFFERENTIAL/PLATELET
Basophils Absolute: 0.1 10*3/uL (ref 0.0–0.1)
Basophils Relative: 0.7 % (ref 0.0–3.0)
Eosinophils Absolute: 0.1 10*3/uL (ref 0.0–0.7)
Eosinophils Relative: 0.9 % (ref 0.0–5.0)
HCT: 35.1 % — ABNORMAL LOW (ref 36.0–46.0)
Hemoglobin: 11.4 g/dL — ABNORMAL LOW (ref 12.0–15.0)
Lymphocytes Relative: 25.2 % (ref 12.0–46.0)
Lymphs Abs: 2 10*3/uL (ref 0.7–4.0)
MCHC: 32.4 g/dL (ref 30.0–36.0)
MCV: 87.5 fl (ref 78.0–100.0)
Monocytes Absolute: 0.5 10*3/uL (ref 0.1–1.0)
Monocytes Relative: 6.8 % (ref 3.0–12.0)
Neutro Abs: 5.2 10*3/uL (ref 1.4–7.7)
Neutrophils Relative %: 66.4 % (ref 43.0–77.0)
Platelets: 500 10*3/uL — ABNORMAL HIGH (ref 150.0–400.0)
RBC: 4.01 Mil/uL (ref 3.87–5.11)
RDW: 12.5 % (ref 11.5–15.5)
WBC: 7.9 10*3/uL (ref 4.0–10.5)

## 2020-02-11 ENCOUNTER — Encounter: Payer: Self-pay | Admitting: Family Medicine

## 2020-02-12 MED ORDER — ALPRAZOLAM 0.5 MG PO TABS: 0.5000 mg | ORAL_TABLET | Freq: Two times a day (BID) | ORAL | 1 refills | Status: DC | PRN

## 2020-02-20 ENCOUNTER — Other Ambulatory Visit: Payer: Self-pay | Admitting: Family Medicine

## 2020-02-20 MED ORDER — NORGESTIMATE-ETH ESTRADIOL 0.25-35 MG-MCG PO TABS: ORAL_TABLET | ORAL | 1 refills | Status: DC

## 2020-02-26 ENCOUNTER — Other Ambulatory Visit: Payer: Self-pay

## 2020-02-26 ENCOUNTER — Encounter: Payer: Self-pay | Admitting: Family Medicine

## 2020-02-26 ENCOUNTER — Telehealth (INDEPENDENT_AMBULATORY_CARE_PROVIDER_SITE_OTHER): Payer: 59 | Admitting: Family Medicine

## 2020-02-26 DIAGNOSIS — F339 Major depressive disorder, recurrent, unspecified: Secondary | ICD-10-CM

## 2020-02-26 DIAGNOSIS — R0789 Other chest pain: Secondary | ICD-10-CM | POA: Diagnosis not present

## 2020-02-26 MED ORDER — FLUOXETINE HCL 40 MG PO CAPS: 40.0000 mg | ORAL_CAPSULE | Freq: Every day | ORAL | 3 refills | Status: DC

## 2020-02-26 NOTE — Progress Notes (Signed)
I have discussed the procedure for the virtual visit with the patient who has given consent to proceed with assessment and treatment.   Pt unable to obtain vitals.   Nicha Hemann L Leelan Rajewski, CMA     

## 2020-02-26 NOTE — Progress Notes (Signed)
Virtual Visit via Video   I connected with patient on 02/26/20 at 10:30 AM EDT by a video enabled telemedicine application and verified that I am speaking with the correct person using two identifiers.  Location patient: Home Location provider: Acupuncturist, Office Persons participating in the virtual visit: Patient, Provider, [place] (Jess B)  I discussed the limitations of evaluation and management by telemedicine and the availability of in person appointments. The patient expressed understanding and agreed to proceed.  Subjective:   HPI:   Anxiety/depression- 'I just have some bad days'.  Pt is having intermittent L sided, dull CP.  Pt was very stressed yesterday and feels that the pain is due to anxiety.  Pain will radiate up under armpit.  Working a lot of hours, very few days off.  Dad is not doing well- lesions in kidney, liver, stomach.  Unable to eat. Lost grandma in January. Taking Alprazolam at night for sleep.  On Prozac 20mg  daily.  ROS:   See pertinent positives and negatives per HPI.  Patient Active Problem List   Diagnosis Date Noted  . Vitamin D deficiency 09/03/2018  . Obstructive sleep apnea 12/20/2015  . Dysphagia 10/11/2015  . Snoring 08/18/2015  . Lung nodule 09/16/2014  . Soft tissue mass 08/18/2014  . Obesity 07/22/2014  . Pulmonary nodules 07/22/2014  . Thrombocytosis ([place]) 06/02/2014  . Normocytic anemia 06/02/2014  . SOB (shortness of breath) 03/13/2014  . Birth control 05/07/2013  . HTN (hypertension) 12/02/2012  . Physical exam 08/22/2012    Social History   Tobacco Use  . Smoking status: Never Smoker  . Smokeless tobacco: Never Used  Substance Use Topics  . Alcohol use: Yes    Alcohol/week: 0.0 standard drinks    Comment: occasionally wine 1-2 glasses q2 weeks    Current Outpatient Medications:  .  albuterol (VENTOLIN HFA) 108 (90 Base) MCG/ACT inhaler, Inhale 2 puffs into the lungs every 6 (six) hours as needed for wheezing or  shortness of breath., Disp: 18 g, Rfl: 1 .  ALPRAZolam (XANAX) 0.5 MG tablet, Take 1 tablet (0.5 mg total) by mouth 2 (two) times daily as needed for anxiety., Disp: 30 tablet, Rfl: 1 .  amLODipine (NORVASC) 5 MG tablet, Take 1 tablet (5 mg total) by mouth daily., Disp: 90 tablet, Rfl: 0 .  FLUoxetine (PROZAC) 20 MG capsule, TAKE 1 CAPSULE BY MOUTH EVERY DAY, Disp: 90 capsule, Rfl: 1 .  hydrochlorothiazide (HYDRODIURIL) 25 MG tablet, TAKE 1 TABLET BY MOUTH EVERY DAY, Disp: 90 tablet, Rfl: 1 .  norgestimate-ethinyl estradiol (SPRINTEC 28) 0.25-35 MG-MCG tablet, TAKE 1 TABLET BY MOUTH DAILY. SKIP PLACEBO PILLS AND GO RIGHT INTO NEXT PACK, Disp: 112 tablet, Rfl: 1 .  ondansetron (ZOFRAN ODT) 4 MG disintegrating tablet, Take 1 tablet (4 mg total) by mouth every 6 (six) hours as needed for nausea or vomiting., Disp: 20 tablet, Rfl: 0  No Known Allergies  Objective:   There were no vitals taken for this visit.  AAOx3 NCAT, EOMI No obvious CN deficits Coloring WNL Pt is able to speak clearly, coherently without shortness of breath or increased work of breathing.  Thought process is linear Pt is tearful throughout visit  Assessment and Plan:   Chest pain- new.  Musculoskeletal in nature.  Pt is able to grab her L pec insertion and indicate that this is the source of her pain.  Reviewed her chart and discussed that she had completely normal cardiac work up in 2015.  Reassurance was  provided.  Depression- deteriorated.  pt is clearly grieving the loss of her grandmother and fears for the upcoming loss of her father.  Encouraged her to seek counseling to process these feelings.  Will increase fluoxetine to 40mg  daily and continue benzos PRN.  Pt expressed understanding and is in agreement w/ plan.    [name], MD 02/26/2020

## 2020-03-01 ENCOUNTER — Telehealth: Payer: Self-pay | Admitting: Family Medicine

## 2020-03-01 NOTE — Telephone Encounter (Signed)
LM asking pt to call back to schedule a 3wk reck on depression from appt with KT on 02/25/20

## 2020-03-18 ENCOUNTER — Other Ambulatory Visit: Payer: Self-pay

## 2020-03-18 ENCOUNTER — Encounter: Payer: Self-pay | Admitting: Family Medicine

## 2020-03-18 ENCOUNTER — Other Ambulatory Visit: Payer: Self-pay | Admitting: Family Medicine

## 2020-03-18 ENCOUNTER — Telehealth (INDEPENDENT_AMBULATORY_CARE_PROVIDER_SITE_OTHER): Payer: 59 | Admitting: Family Medicine

## 2020-03-18 DIAGNOSIS — F339 Major depressive disorder, recurrent, unspecified: Secondary | ICD-10-CM | POA: Diagnosis not present

## 2020-03-18 DIAGNOSIS — Z1231 Encounter for screening mammogram for malignant neoplasm of breast: Secondary | ICD-10-CM

## 2020-03-18 NOTE — Progress Notes (Signed)
   Virtual Visit via Video   I connected with patient on 03/18/20 at 11:30 AM EDT by a video enabled telemedicine application and verified that I am speaking with the correct person using two identifiers.  Location patient: Home Location provider: Acupuncturist, Office Persons participating in the virtual visit: Patient, Provider, [place] (Jess B)  I discussed the limitations of evaluation and management by telemedicine and the availability of in person appointments. The patient expressed understanding and agreed to proceed.  Subjective:   HPI:   Depression- at last visit we increased Prozac to 40mg  daily.  'i'm doing a little bit better'.  Pt is alternating the 20mg  and the 40mg  and this seems to be working for her.  Situation is still difficult but more manageable.  Less tearful.  Sleeping better.  ROS:   See pertinent positives and negatives per HPI.  Patient Active Problem List   Diagnosis Date Noted  . Vitamin D deficiency 09/03/2018  . Obstructive sleep apnea 12/20/2015  . Dysphagia 10/11/2015  . Snoring 08/18/2015  . Lung nodule 09/16/2014  . Soft tissue mass 08/18/2014  . Obesity 07/22/2014  . Pulmonary nodules 07/22/2014  . Thrombocytosis ([place]) 06/02/2014  . Normocytic anemia 06/02/2014  . SOB (shortness of breath) 03/13/2014  . Birth control 05/07/2013  . HTN (hypertension) 12/02/2012  . Physical exam 08/22/2012    Social History   Tobacco Use  . Smoking status: Never Smoker  . Smokeless tobacco: Never Used  Substance Use Topics  . Alcohol use: Yes    Alcohol/week: 0.0 standard drinks    Comment: occasionally wine 1-2 glasses q2 weeks    Current Outpatient Medications:  .  albuterol (VENTOLIN HFA) 108 (90 Base) MCG/ACT inhaler, Inhale 2 puffs into the lungs every 6 (six) hours as needed for wheezing or shortness of breath., Disp: 18 g, Rfl: 1 .  ALPRAZolam (XANAX) 0.5 MG tablet, Take 1 tablet (0.5 mg total) by mouth 2 (two) times daily as needed for  anxiety., Disp: 30 tablet, Rfl: 1 .  amLODipine (NORVASC) 5 MG tablet, Take 1 tablet (5 mg total) by mouth daily., Disp: 90 tablet, Rfl: 0 .  FLUoxetine (PROZAC) 40 MG capsule, Take 1 capsule (40 mg total) by mouth daily., Disp: 90 capsule, Rfl: 3 .  hydrochlorothiazide (HYDRODIURIL) 25 MG tablet, TAKE 1 TABLET BY MOUTH EVERY DAY, Disp: 90 tablet, Rfl: 1 .  norgestimate-ethinyl estradiol (SPRINTEC 28) 0.25-35 MG-MCG tablet, TAKE 1 TABLET BY MOUTH DAILY. SKIP PLACEBO PILLS AND GO RIGHT INTO NEXT PACK, Disp: 112 tablet, Rfl: 1 .  ondansetron (ZOFRAN ODT) 4 MG disintegrating tablet, Take 1 tablet (4 mg total) by mouth every 6 (six) hours as needed for nausea or vomiting., Disp: 20 tablet, Rfl: 0  No Known Allergies  Objective:   There were no vitals taken for this visit.  AAOx3, NAD NCAT, EOMI No obvious CN deficits Coloring WNL Pt is able to speak clearly, coherently without shortness of breath or increased work of breathing.  Thought process is linear.  Mood is appropriate.   Assessment and Plan:   Depression- improved since increasing Prozac dose to 40mg  daily.  Feeling better, able to handle things better, sleeping better.  No side effects from increased dose.  No med changes at this time.  Will continue to follow.   [name], MD 03/18/2020

## 2020-03-18 NOTE — Progress Notes (Signed)
I have discussed the procedure for the virtual visit with the patient who has given consent to proceed with assessment and treatment.   Pt unable to obtain vitals, states no depression.   Pamela Clarke, CMA

## 2020-03-19 ENCOUNTER — Ambulatory Visit
Admission: RE | Admit: 2020-03-19 | Discharge: 2020-03-19 | Disposition: A | Discharge status: Home / Self Care | Payer: 59 | Source: Ambulatory Visit | Attending: Family Medicine | Admitting: Family Medicine

## 2020-03-19 DIAGNOSIS — Z1231 Encounter for screening mammogram for malignant neoplasm of breast: Secondary | ICD-10-CM

## 2020-04-25 ENCOUNTER — Other Ambulatory Visit: Payer: Self-pay | Admitting: Family Medicine

## 2020-06-08 ENCOUNTER — Ambulatory Visit: Payer: 59 | Admitting: Family Medicine

## 2020-07-29 ENCOUNTER — Other Ambulatory Visit: Payer: Self-pay

## 2020-07-29 ENCOUNTER — Ambulatory Visit (INDEPENDENT_AMBULATORY_CARE_PROVIDER_SITE_OTHER): Payer: No Typology Code available for payment source | Admitting: Family Medicine

## 2020-07-29 ENCOUNTER — Encounter: Payer: Self-pay | Admitting: Family Medicine

## 2020-07-29 VITALS — BP 124/81 | HR 83 | Temp 97.4°F | Resp 16 | Ht 64.0 in | Wt 224.2 lb

## 2020-07-29 DIAGNOSIS — R5383 Other fatigue: Secondary | ICD-10-CM

## 2020-07-29 DIAGNOSIS — Z6838 Body mass index (BMI) 38.0-38.9, adult: Secondary | ICD-10-CM

## 2020-07-29 DIAGNOSIS — E6609 Other obesity due to excess calories: Secondary | ICD-10-CM | POA: Diagnosis not present

## 2020-07-29 DIAGNOSIS — I10 Essential (primary) hypertension: Secondary | ICD-10-CM

## 2020-07-29 DIAGNOSIS — F418 Other specified anxiety disorders: Secondary | ICD-10-CM

## 2020-07-29 LAB — HEPATIC FUNCTION PANEL
ALT: 18 U/L (ref 0–35)
AST: 18 U/L (ref 0–37)
Albumin: 4 g/dL (ref 3.5–5.2)
Alkaline Phosphatase: 117 U/L (ref 39–117)
Bilirubin, Direct: 0.2 mg/dL (ref 0.0–0.3)
Total Bilirubin: 0.6 mg/dL (ref 0.2–1.2)
Total Protein: 7.4 g/dL (ref 6.0–8.3)

## 2020-07-29 LAB — B12 AND FOLATE PANEL
Folate: 14.8 ng/mL (ref >5.9)
Vitamin B-12: 337 pg/mL (ref 211–911)

## 2020-07-29 LAB — BASIC METABOLIC PANEL
BUN: 14 mg/dL (ref 6–23)
CO2: 28 mEq/L (ref 19–32)
Calcium: 9.5 mg/dL (ref 8.4–10.5)
Chloride: 101 mEq/L (ref 96–112)
Creatinine, Ser: 0.95 mg/dL (ref 0.40–1.20)
GFR: 77.38 mL/min (ref >60.00)
Glucose, Bld: 91 mg/dL (ref 70–99)
Potassium: 4.3 mEq/L (ref 3.5–5.1)
Sodium: 137 mEq/L (ref 135–145)

## 2020-07-29 LAB — CBC WITH DIFFERENTIAL/PLATELET
Basophils Absolute: 0.1 10*3/uL (ref 0.0–0.1)
Basophils Relative: 0.7 % (ref 0.0–3.0)
Eosinophils Absolute: 0.1 10*3/uL (ref 0.0–0.7)
Eosinophils Relative: 0.8 % (ref 0.0–5.0)
HCT: 36.5 % (ref 36.0–46.0)
Hemoglobin: 11.7 g/dL — ABNORMAL LOW (ref 12.0–15.0)
Lymphocytes Relative: 20.9 % (ref 12.0–46.0)
Lymphs Abs: 1.9 10*3/uL (ref 0.7–4.0)
MCHC: 32.2 g/dL (ref 30.0–36.0)
MCV: 86.6 fl (ref 78.0–100.0)
Monocytes Absolute: 0.7 10*3/uL (ref 0.1–1.0)
Monocytes Relative: 7.4 % (ref 3.0–12.0)
Neutro Abs: 6.4 10*3/uL (ref 1.4–7.7)
Neutrophils Relative %: 70.2 % (ref 43.0–77.0)
Platelets: 544 10*3/uL — ABNORMAL HIGH (ref 150.0–400.0)
RBC: 4.22 Mil/uL (ref 3.87–5.11)
RDW: 12.1 % (ref 11.5–15.5)
WBC: 9.2 10*3/uL (ref 4.0–10.5)

## 2020-07-29 LAB — LIPID PANEL
Cholesterol: 112 mg/dL (ref 0–200)
HDL: 57.7 mg/dL (ref >39.00)
LDL Cholesterol: 36 mg/dL (ref 0–99)
NonHDL: 54.18
Total CHOL/HDL Ratio: 2
Triglycerides: 93 mg/dL (ref 0.0–149.0)
VLDL: 18.6 mg/dL (ref 0.0–40.0)

## 2020-07-29 LAB — TSH: TSH: 2.12 u[IU]/mL (ref 0.35–4.50)

## 2020-07-29 LAB — VITAMIN D 25 HYDROXY (VIT D DEFICIENCY, FRACTURES): VITD: 45.98 ng/mL (ref 30.00–100.00)

## 2020-07-29 NOTE — Assessment & Plan Note (Signed)
Ongoing issue.  Currently well controlled.  No med changes at this time.

## 2020-07-29 NOTE — Progress Notes (Signed)
   Subjective:    Patient ID: Pamela Clarke, female    DOB: 1976-05-08, 44 y.o.   MRN: [id number]  HPI HTN- chronic problem, on Amlodipine 5mg  daily and HCTZ 25mg  daily w/ good control.  No CP, SOB, HAs, visual changes, edema.  Obesity- chronic problem, pt has gained 5 lbs since last visit.  Attempting to exercise w/ coworkers.  Low energy.  Depression/anxiety- chronic problem, on Prozac 20mg  daily and Alprazolam prn.  Pt reports mood is better.   Review of Systems For ROS see HPI   This visit occurred during the SARS-CoV-2 public health emergency.  Safety protocols were in place, including screening questions prior to the visit, additional usage of staff PPE, and extensive cleaning of exam room while observing appropriate contact time as indicated for disinfecting solutions.       Objective:   Physical Exam Vitals reviewed.  Constitutional:      General: She is not in acute distress.    Appearance: She is well-developed. She is obese.  HENT:     Head: Normocephalic and atraumatic.  Eyes:     Conjunctiva/sclera: Conjunctivae normal.     Pupils: Pupils are equal, round, and reactive to light.  Neck:     Thyroid: No thyromegaly.  Cardiovascular:     Rate and Rhythm: Normal rate and regular rhythm.     Heart sounds: Normal heart sounds. No murmur heard.   Pulmonary:     Effort: Pulmonary effort is normal. No respiratory distress.     Breath sounds: Normal breath sounds.  Abdominal:     General: There is no distension.     Palpations: Abdomen is soft.     Tenderness: There is no abdominal tenderness.  Musculoskeletal:     Cervical back: Normal range of motion and neck supple.  Lymphadenopathy:     Cervical: No cervical adenopathy.  Skin:    General: Skin is warm and dry.  Neurological:     Mental Status: She is alert and oriented to person, place, and time.  Psychiatric:        Behavior: Behavior normal.           Assessment & Plan:

## 2020-07-29 NOTE — Assessment & Plan Note (Signed)
Pt has hx of similar.  Suspect this is situational w/ dad's illness and ongoing covid situation.  Check labs to r/o metabolic cause.  Will follow.

## 2020-07-29 NOTE — Assessment & Plan Note (Signed)
Deteriorated.  Pt has gained 5 lbs since last visit.  Stressed need for healthy diet and regular exercise.  Will follow.

## 2020-07-29 NOTE — Assessment & Plan Note (Signed)
Chronic problem.  Well controlled today.  Asymptomatic w/ exception of fatigue.  Check labs.  No anticipated med changes 

## 2020-07-29 NOTE — Patient Instructions (Addendum)
Schedule your complete physical in 6 months We'll notify you of your lab results and make any changes if needed Continue to work on healthy diet and regular exercise- you can do it! Call with any questions or concerns Stay Safe!  Stay Healthy! 

## 2020-07-30 ENCOUNTER — Encounter: Payer: Self-pay | Admitting: General Practice

## 2020-08-05 ENCOUNTER — Other Ambulatory Visit: Payer: Self-pay | Admitting: Family Medicine

## 2020-08-23 ENCOUNTER — Other Ambulatory Visit: Payer: Self-pay | Admitting: Family Medicine

## 2020-08-23 MED ORDER — AMLODIPINE BESYLATE 5 MG PO TABS: 5.0000 mg | ORAL_TABLET | Freq: Every day | ORAL | 0 refills | Status: DC

## 2020-09-15 ENCOUNTER — Encounter: Payer: Self-pay | Admitting: Family Medicine

## 2020-09-15 ENCOUNTER — Other Ambulatory Visit (INDEPENDENT_AMBULATORY_CARE_PROVIDER_SITE_OTHER): Payer: No Typology Code available for payment source

## 2020-09-15 DIAGNOSIS — H2010 Chronic iridocyclitis, unspecified eye: Secondary | ICD-10-CM

## 2020-09-15 LAB — C-REACTIVE PROTEIN: CRP: 1.9 mg/dL (ref 0.5–20.0)

## 2020-09-15 LAB — SEDIMENTATION RATE: Sed Rate: 50 mm/hr — ABNORMAL HIGH (ref 0–20)

## 2020-09-17 LAB — QUANTIFERON-TB GOLD PLUS
Mitogen-NIL: >10 IU/mL
NIL: 0.02 IU/mL
QuantiFERON-TB Gold Plus: NEGATIVE
TB1-NIL: 0 IU/mL
TB2-NIL: 0.01 IU/mL

## 2020-09-17 LAB — RPR: RPR Ser Ql: NONREACTIVE

## 2020-09-17 LAB — ANGIOTENSIN CONVERTING ENZYME: Angiotensin-Converting Enzyme: 9 U/L (ref 9–67)

## 2020-09-17 LAB — HLA-B27 ANTIGEN: HLA-B27 Antigen: NEGATIVE

## 2020-09-17 LAB — B. BURGDORFI ANTIBODIES: B burgdorferi Ab IgG+IgM: <0.9 index

## 2020-09-22 ENCOUNTER — Encounter: Payer: Self-pay | Admitting: Family Medicine

## 2020-09-24 ENCOUNTER — Other Ambulatory Visit: Payer: Self-pay

## 2020-09-24 DIAGNOSIS — H209 Unspecified iridocyclitis: Secondary | ICD-10-CM

## 2020-10-05 ENCOUNTER — Other Ambulatory Visit: Payer: Self-pay | Admitting: Family Medicine

## 2020-10-27 ENCOUNTER — Other Ambulatory Visit: Payer: Self-pay | Admitting: Family Medicine

## 2020-11-16 ENCOUNTER — Other Ambulatory Visit: Payer: Self-pay | Admitting: Family Medicine

## 2021-01-10 ENCOUNTER — Other Ambulatory Visit: Payer: Self-pay | Admitting: Family Medicine

## 2021-01-13 ENCOUNTER — Telehealth (INDEPENDENT_AMBULATORY_CARE_PROVIDER_SITE_OTHER): Payer: No Typology Code available for payment source | Admitting: Family Medicine

## 2021-01-13 ENCOUNTER — Encounter: Payer: Self-pay | Admitting: Family Medicine

## 2021-01-13 DIAGNOSIS — J029 Acute pharyngitis, unspecified: Secondary | ICD-10-CM | POA: Diagnosis not present

## 2021-01-13 MED ORDER — AMOXICILLIN 875 MG PO TABS: 875.0000 mg | ORAL_TABLET | Freq: Two times a day (BID) | ORAL | 0 refills | Status: DC

## 2021-01-13 NOTE — Progress Notes (Signed)
I connected with  Pamela Clarke on 01/13/21 by a video enabled telemedicine application and verified that I am speaking with the correct person using two identifiers.   I discussed the limitations of evaluation and management by telemedicine. The patient expressed understanding and agreed to proceed.

## 2021-01-13 NOTE — Progress Notes (Signed)
Virtual Visit via Video   I connected with patient on 01/13/21 at 11:30 AM EST by a video enabled telemedicine application and verified that I am speaking with the correct person using two identifiers.  Location patient: Home Location provider: [facility], Office Persons participating in the virtual visit: Patient, Provider, [place] (Sabrina M)  I discussed the limitations of evaluation and management by telemedicine and the availability of in person appointments. The patient expressed understanding and agreed to proceed.  Subjective:   HPI:   Sore throat- went to work on [place] and 'all of a sudden I got a bad sore throat.  Like blades cutting me'.  States it is incredibly painful to swallow.  Low grade temp Monday night and yesterday- Tm 99.6.  Denies sinus pain/pressure.  No body aches.  + night sweats- 'waking up drenched'  Took home test yesterday- negative.  Some abd discomfort.  + LAD  ROS:   See pertinent positives and negatives per HPI.  Patient Active Problem List   Diagnosis Date Noted  . Fatigue 07/29/2020  . Vitamin D deficiency 09/03/2018  . Obstructive sleep apnea 12/20/2015  . Dysphagia 10/11/2015  . Snoring 08/18/2015  . Lung nodule 09/16/2014  . Soft tissue mass 08/18/2014  . Obesity 07/22/2014  . Pulmonary nodules 07/22/2014  . Thrombocytosis 06/02/2014  . Normocytic anemia 06/02/2014  . Depression with anxiety 03/31/2014  . SOB (shortness of breath) 03/13/2014  . Birth control 05/07/2013  . HTN (hypertension) 12/02/2012  . Physical exam 08/22/2012    Social History   Tobacco Use  . Smoking status: Never Smoker  . Smokeless tobacco: Never Used  Substance Use Topics  . Alcohol use: Yes    Alcohol/week: 0.0 standard drinks    Comment: occasionally wine 1-2 glasses q2 weeks    Current Outpatient Medications:  .  amLODipine (NORVASC) 5 MG tablet, TAKE 1 TABLET BY MOUTH EVERY DAY, Disp: 90 tablet, Rfl: 0 .  FLUoxetine (PROZAC) 20 MG capsule,  TAKE 1 CAPSULE BY MOUTH EVERY DAY, Disp: 90 capsule, Rfl: 1 .  hydrochlorothiazide (HYDRODIURIL) 25 MG tablet, TAKE 1 TABLET BY MOUTH EVERY DAY, Disp: 90 tablet, Rfl: 1 .  norgestimate-ethinyl estradiol (SPRINTEC 28) 0.25-35 MG-MCG tablet, TAKE 1 TABLET BY MOUTH DAILY. SKIP PLACEBO PILLS AND GO RIGHT INTO NEXT PACK, Disp: 112 tablet, Rfl: 1 .  albuterol (VENTOLIN HFA) 108 (90 Base) MCG/ACT inhaler, Inhale 2 puffs into the lungs every 6 (six) hours as needed for wheezing or shortness of breath. (Patient not taking: Reported on 01/13/2021), Disp: 18 g, Rfl: 1 .  ALPRAZolam (XANAX) 0.5 MG tablet, Take 1 tablet (0.5 mg total) by mouth 2 (two) times daily as needed for anxiety. (Patient not taking: Reported on 01/13/2021), Disp: 30 tablet, Rfl: 1 .  ondansetron (ZOFRAN ODT) 4 MG disintegrating tablet, Take 1 tablet (4 mg total) by mouth every 6 (six) hours as needed for nausea or vomiting. (Patient not taking: Reported on 01/13/2021), Disp: 20 tablet, Rfl: 0  No Known Allergies  Objective:   There were no vitals taken for this visit. AAOx3, NAD NCAT, EOMI No obvious CN deficits Pt is able to speak clearly, coherently without shortness of breath or increased work of breathing.  Thought process is linear.  Mood is appropriate.   Assessment and Plan:   Pharyngitis- new.  Given the severe sore throat and night sweats, I have concern for COVID.  Pt does as well.  She will get tested.  In the meantime, will start  Amoxicillin to cover possible strep.  Reviewed supportive care and red flags that should prompt return.  Pt expressed understanding and is in agreement w/ plan.    [name], MD 01/13/2021

## 2021-02-07 ENCOUNTER — Other Ambulatory Visit: Payer: Self-pay | Admitting: Family Medicine

## 2021-02-20 ENCOUNTER — Other Ambulatory Visit: Payer: Self-pay | Admitting: Family Medicine

## 2021-04-07 ENCOUNTER — Encounter: Payer: Self-pay | Admitting: Family Medicine

## 2021-04-07 ENCOUNTER — Telehealth (INDEPENDENT_AMBULATORY_CARE_PROVIDER_SITE_OTHER): Payer: No Typology Code available for payment source | Admitting: Family Medicine

## 2021-04-07 DIAGNOSIS — J209 Acute bronchitis, unspecified: Secondary | ICD-10-CM | POA: Diagnosis not present

## 2021-04-07 MED ORDER — PREDNISONE 10 MG PO TABS: ORAL_TABLET | ORAL | 0 refills | Status: DC

## 2021-04-07 MED ORDER — GUAIFENESIN-CODEINE 100-10 MG/5ML PO SYRP: 10.0000 mL | ORAL_SOLUTION | Freq: Three times a day (TID) | ORAL | 0 refills | Status: DC | PRN

## 2021-04-07 MED ORDER — AZITHROMYCIN 250 MG PO TABS: ORAL_TABLET | ORAL | 0 refills | Status: DC

## 2021-04-07 NOTE — Progress Notes (Signed)
Virtual Visit via Video   I connected with patient on 04/07/21 at 12:30 PM EDT by a video enabled telemedicine application and verified that I am speaking with the correct person using two identifiers.  Location patient: Home Location provider: [facility], Office Persons participating in the virtual visit: Patient, Provider, [place] (Sabrina M)  I discussed the limitations of evaluation and management by telemedicine and the availability of in person appointments. The patient expressed understanding and agreed to proceed.  Subjective:   HPI:   URI- sxs started 4/10 while on vacation in [place].  Initially thought it was a cold and started treating w/ OTC meds.  Then thought it was allergy related and restarted the Allegra w/o improvement.  Started w/ sore throat.  3 negative COVID tests.  A few days ago pt noticed posterior cervical LNs.  No known fever but + chills and 'very bad night sweats'.  No known sick contacts.  + nasal congestion.  Denies sinus pain/pressure.  Cough is not productive but is painful.  No ear pain.  No N/V/D.  ROS:   See pertinent positives and negatives per HPI.  Patient Active Problem List   Diagnosis Date Noted  . Fatigue 07/29/2020  . Vitamin D deficiency 09/03/2018  . Obstructive sleep apnea 12/20/2015  . Dysphagia 10/11/2015  . Snoring 08/18/2015  . Lung nodule 09/16/2014  . Soft tissue mass 08/18/2014  . Obesity 07/22/2014  . Pulmonary nodules 07/22/2014  . Thrombocytosis 06/02/2014  . Normocytic anemia 06/02/2014  . Depression with anxiety 03/31/2014  . SOB (shortness of breath) 03/13/2014  . Birth control 05/07/2013  . HTN (hypertension) 12/02/2012  . Physical exam 08/22/2012    Social History   Tobacco Use  . Smoking status: Never Smoker  . Smokeless tobacco: Never Used  Substance Use Topics  . Alcohol use: Yes    Alcohol/week: 0.0 standard drinks    Comment: occasionally wine 1-2 glasses q2 weeks    Current Outpatient  Medications:  .  amLODipine (NORVASC) 5 MG tablet, TAKE 1 TABLET BY MOUTH EVERY DAY, Disp: 90 tablet, Rfl: 0 .  FLUoxetine (PROZAC) 20 MG capsule, TAKE 1 CAPSULE BY MOUTH EVERY DAY, Disp: 90 capsule, Rfl: 1 .  hydrochlorothiazide (HYDRODIURIL) 25 MG tablet, Take 1 tablet (25 mg total) by mouth daily. Due for physical for more refills, call the office to schedule, Disp: 90 tablet, Rfl: 0 .  [facility] COVID-19 RAPID TEST KIT, , Disp: , Rfl:  .  norgestimate-ethinyl estradiol (SPRINTEC 28) 0.25-35 MG-MCG tablet, TAKE 1 TABLET BY MOUTH DAILY. SKIP PLACEBO PILLS AND GO RIGHT INTO NEXT PACK, Disp: 112 tablet, Rfl: 1 .  albuterol (VENTOLIN HFA) 108 (90 Base) MCG/ACT inhaler, Inhale 2 puffs into the lungs every 6 (six) hours as needed for wheezing or shortness of breath. (Patient not taking: No sig reported), Disp: 18 g, Rfl: 1 .  ALPRAZolam (XANAX) 0.5 MG tablet, Take 1 tablet (0.5 mg total) by mouth 2 (two) times daily as needed for anxiety. (Patient not taking: No sig reported), Disp: 30 tablet, Rfl: 1 .  amoxicillin (AMOXIL) 875 MG tablet, Take 1 tablet (875 mg total) by mouth 2 (two) times daily. (Patient not taking: Reported on 04/07/2021), Disp: 20 tablet, Rfl: 0 .  ondansetron (ZOFRAN ODT) 4 MG disintegrating tablet, Take 1 tablet (4 mg total) by mouth every 6 (six) hours as needed for nausea or vomiting. (Patient not taking: No sig reported), Disp: 20 tablet, Rfl: 0  No Known Allergies  Objective:  There were no vitals taken for this visit. AAOx3, NAD NCAT, EOMI No obvious CN deficits Coloring WNL Pt is able to speak clearly, coherently without shortness of breath or increased work of breathing.  Thought process is linear.  Mood is appropriate.   Assessment and Plan:   Bronchitis- new.  Pt has had sxs for ~25 days w/o improvement.  Negative COVID x3.  Chest is tight, cough is painful.  Will start Zpack to cover for atypical infxns and prednisone to improve cough and congestion.  Reviewed  supportive care and red flags that should prompt return.  Pt expressed understanding and is in agreement w/ plan.    [name], MD 04/07/2021

## 2021-04-07 NOTE — Progress Notes (Signed)
I connected with  Pamela Clarke on 04/07/21 by a video enabled telemedicine application and verified that I am speaking with the correct person using two identifiers.   I discussed the limitations of evaluation and management by telemedicine. The patient expressed understanding and agreed to proceed.

## 2021-04-08 ENCOUNTER — Telehealth: Payer: No Typology Code available for payment source | Admitting: Family Medicine

## 2021-04-14 ENCOUNTER — Other Ambulatory Visit: Payer: Self-pay | Admitting: Family Medicine

## 2021-05-29 ENCOUNTER — Other Ambulatory Visit: Payer: Self-pay | Admitting: Family Medicine

## 2021-06-01 ENCOUNTER — Encounter: Payer: Self-pay | Admitting: *Deleted

## 2021-06-01 ENCOUNTER — Other Ambulatory Visit: Payer: Self-pay | Admitting: Family Medicine

## 2021-07-16 ENCOUNTER — Other Ambulatory Visit: Payer: Self-pay | Admitting: Family Medicine

## 2021-08-22 ENCOUNTER — Other Ambulatory Visit: Payer: Self-pay

## 2021-08-22 ENCOUNTER — Encounter: Payer: Self-pay | Admitting: Family Medicine

## 2021-08-22 ENCOUNTER — Ambulatory Visit (INDEPENDENT_AMBULATORY_CARE_PROVIDER_SITE_OTHER): Payer: No Typology Code available for payment source | Admitting: Family Medicine

## 2021-08-22 VITALS — BP 122/78 | HR 69 | Temp 98.2°F | Resp 16 | Ht 64.0 in | Wt 200.6 lb

## 2021-08-22 DIAGNOSIS — R1013 Epigastric pain: Secondary | ICD-10-CM

## 2021-08-22 DIAGNOSIS — A530 Latent syphilis, unspecified as early or late: Secondary | ICD-10-CM

## 2021-08-22 DIAGNOSIS — Z1501 Genetic susceptibility to malignant neoplasm of breast: Secondary | ICD-10-CM

## 2021-08-22 DIAGNOSIS — K920 Hematemesis: Secondary | ICD-10-CM | POA: Diagnosis not present

## 2021-08-22 DIAGNOSIS — Z1509 Genetic susceptibility to other malignant neoplasm: Secondary | ICD-10-CM

## 2021-08-22 DIAGNOSIS — Z1589 Genetic susceptibility to other disease: Secondary | ICD-10-CM

## 2021-08-22 LAB — CBC WITH DIFFERENTIAL/PLATELET
Basophils Absolute: 0.1 10*3/uL (ref 0.0–0.1)
Basophils Relative: 0.6 % (ref 0.0–3.0)
Eosinophils Absolute: 0 10*3/uL (ref 0.0–0.7)
Eosinophils Relative: 0.3 % (ref 0.0–5.0)
HCT: 35.8 % — ABNORMAL LOW (ref 36.0–46.0)
Hemoglobin: 11.5 g/dL — ABNORMAL LOW (ref 12.0–15.0)
Lymphocytes Relative: 21.5 % (ref 12.0–46.0)
Lymphs Abs: 2.1 10*3/uL (ref 0.7–4.0)
MCHC: 32.1 g/dL (ref 30.0–36.0)
MCV: 86.7 fl (ref 78.0–100.0)
Monocytes Absolute: 0.7 10*3/uL (ref 0.1–1.0)
Monocytes Relative: 7.2 % (ref 3.0–12.0)
Neutro Abs: 7 10*3/uL (ref 1.4–7.7)
Neutrophils Relative %: 70.4 % (ref 43.0–77.0)
Platelets: 513 10*3/uL — ABNORMAL HIGH (ref 150.0–400.0)
RBC: 4.13 Mil/uL (ref 3.87–5.11)
RDW: 12.5 % (ref 11.5–15.5)
WBC: 10 10*3/uL (ref 4.0–10.5)

## 2021-08-22 MED ORDER — SUCRALFATE 1 G PO TABS: 1.0000 g | ORAL_TABLET | Freq: Three times a day (TID) | ORAL | 0 refills | Status: DC

## 2021-08-22 MED ORDER — ONDANSETRON HCL 4 MG PO TABS: 4.0000 mg | ORAL_TABLET | Freq: Three times a day (TID) | ORAL | 0 refills | Status: DC | PRN

## 2021-08-22 MED ORDER — PANTOPRAZOLE SODIUM 40 MG PO TBEC: 40.0000 mg | DELAYED_RELEASE_TABLET | Freq: Every day | ORAL | 3 refills | Status: DC

## 2021-08-22 NOTE — Progress Notes (Signed)
Subjective:    Patient ID: Pamela Clarke, female    DOB: [birth year], 45 y.o.   MRN: [id number]  HPI Abnormal genetic testing- pt had testing done at [facility]YN and was told she has an abnormal gene mutation but 'they don't know what it is'.  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  Did speak w/ genetic counseling and has [place] of breasts scheduled for November.    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  Had an episode of Uveitis in the past and all testing was normal.  Blood streaked vomiting- + nausea recently.  Not currently on reflux medication.  Has hx of H pylori.  Nausea is worse when stomach is empty.   Review of Systems For ROS see HPI   This visit occurred during the SARS-CoV-2 public health emergency.  Safety protocols were in place, including screening questions prior to the visit, additional usage of staff PPE, and extensive cleaning of exam room while observing appropriate contact time as indicated for disinfecting solutions.      Objective:   Physical Exam Constitutional:      General: She is not in acute distress.    Appearance: Normal appearance. She is well-developed. She is not ill-appearing.  HENT:     Head: Normocephalic and atraumatic.  Eyes:     Conjunctiva/sclera: Conjunctivae normal.     Pupils: Pupils are equal, round, and reactive to light.  Neck:     Thyroid: No thyromegaly.  Cardiovascular:     Rate and Rhythm: Normal rate and regular rhythm.     Heart sounds: Normal heart sounds. No murmur heard. Pulmonary:     Effort: Pulmonary effort is normal. No respiratory distress.     Breath sounds: Normal breath sounds.  Abdominal:     General: There is no distension.     Palpations: Abdomen is soft. There is no mass.     Tenderness: There is abdominal tenderness (epigastric TTP). There is no guarding or rebound.  Musculoskeletal:     Cervical back: Normal range of motion and neck supple.  Lymphadenopathy:     Cervical: No  cervical adenopathy.  Skin:    General: Skin is warm and dry.  Neurological:     Mental Status: She is alert and oriented to person, place, and time.  Psychiatric:        Behavior: Behavior normal.          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  I indicated that most likely it would mean increased breast cancer surveillance but I certainly understand her concern.  She wants to talk to an oncologist about the situation.  Referral placed.  + RPR- pt reports she had a + RPR at GYN but confirmatory testing was negative.  She has had uveitis in the past and she had an RPR at that time.  We discussed that we will repeat the confirmatory testing and we will also test for other things that can cause a false positive like lyme or autoimmune disorders.  Pt expressed understanding and is in agreement w/ plan.   Hematemesis w/ nausea/epigastric pain- new.  Pt has hx of H pylori.  Not currently on PPI but she reports recent GERD, nausea- particularly when stomach is empty.  This AM she vomited and there were streaks of blood  present.  Check H pylori.  Start prescription strength PPI, carafate.  Check labs to r/o pancreatitis or biliary dysfunction.  Pt expressed understanding and is in agreement w/ plan.

## 2021-08-22 NOTE — Patient Instructions (Signed)
We'll determine follow up based on your lab results We'll notify you of your lab results and make any changes if needed We'll call you with your hematology/oncology appt START the Pantoprazole once daily TAKE the Carafate prior to meals and before bed Use the Zofran as needed for nausea Try and avoid having an empty stomach.  Graze throughout the day Call with any questions or concerns- particularly if you have additional vomiting/bleeding Hang in there!

## 2021-08-23 LAB — HEPATIC FUNCTION PANEL
ALT: 15 U/L (ref 0–35)
AST: 18 U/L (ref 0–37)
Albumin: 4.1 g/dL (ref 3.5–5.2)
Alkaline Phosphatase: 101 U/L (ref 39–117)
Bilirubin, Direct: 0.1 mg/dL (ref 0.0–0.3)
Total Bilirubin: 0.6 mg/dL (ref 0.2–1.2)
Total Protein: 7.5 g/dL (ref 6.0–8.3)

## 2021-08-23 LAB — TSH: TSH: 1.31 u[IU]/mL (ref 0.35–5.50)

## 2021-08-23 LAB — AMYLASE: Amylase: 40 U/L (ref 27–131)

## 2021-08-23 LAB — ANA: Anti Nuclear Antibody (ANA): NEGATIVE

## 2021-08-23 LAB — BASIC METABOLIC PANEL
BUN: 12 mg/dL (ref 6–23)
CO2: 28 mEq/L (ref 19–32)
Calcium: 9.7 mg/dL (ref 8.4–10.5)
Chloride: 99 mEq/L (ref 96–112)
Creatinine, Ser: 0.97 mg/dL (ref 0.40–1.20)
GFR: 70.86 mL/min (ref >60.00)
Glucose, Bld: 80 mg/dL (ref 70–99)
Potassium: 3.4 mEq/L — ABNORMAL LOW (ref 3.5–5.1)
Sodium: 138 mEq/L (ref 135–145)

## 2021-08-23 LAB — LIPASE: Lipase: 14 U/L (ref 11.0–59.0)

## 2021-08-23 LAB — FLUORESCENT TREPONEMAL AB(FTA)-IGG-BLD: Fluorescent Treponemal ABS: NONREACTIVE

## 2021-08-23 LAB — H. PYLORI ANTIBODY, IGG: H Pylori IgG: NEGATIVE

## 2021-08-23 LAB — B. BURGDORFI ANTIBODIES: B burgdorferi Ab IgG+IgM: <0.9 index

## 2021-08-24 ENCOUNTER — Telehealth: Payer: Self-pay | Admitting: Genetic Counselor

## 2021-08-24 NOTE — Telephone Encounter (Signed)
Scheduled appt per 9/19 referral. Per Dr. Alvy Bimler, she recommended pt see a genetic counselor instead of a hematologist. Pt is aware of appt date and time and is aware to arrive 15 mins early.

## 2021-08-31 ENCOUNTER — Inpatient Hospital Stay: Payer: No Typology Code available for payment source

## 2021-08-31 ENCOUNTER — Inpatient Hospital Stay: Payer: No Typology Code available for payment source | Admitting: Genetic Counselor

## 2021-08-31 ENCOUNTER — Other Ambulatory Visit: Payer: Self-pay | Admitting: Family Medicine

## 2021-09-09 ENCOUNTER — Other Ambulatory Visit: Payer: Self-pay | Admitting: Family Medicine

## 2021-10-06 ENCOUNTER — Other Ambulatory Visit: Payer: Self-pay | Admitting: Family Medicine

## 2021-11-17 ENCOUNTER — Other Ambulatory Visit: Payer: Self-pay | Admitting: Family Medicine

## 2021-12-05 ENCOUNTER — Other Ambulatory Visit: Payer: Self-pay | Admitting: Family Medicine

## 2021-12-10 ENCOUNTER — Other Ambulatory Visit: Payer: Self-pay | Admitting: Family Medicine

## 2022-02-18 ENCOUNTER — Other Ambulatory Visit: Payer: Self-pay | Admitting: Family Medicine

## 2022-03-14 ENCOUNTER — Other Ambulatory Visit: Payer: Self-pay | Admitting: Family Medicine

## 2022-03-23 ENCOUNTER — Other Ambulatory Visit: Payer: Self-pay | Admitting: Family Medicine

## 2022-03-23 MED ORDER — AMLODIPINE BESYLATE 5 MG PO TABS: 5.0000 mg | ORAL_TABLET | Freq: Every day | ORAL | 0 refills | Status: DC

## 2022-06-18 ENCOUNTER — Other Ambulatory Visit: Payer: Self-pay | Admitting: Family Medicine

## 2022-06-20 ENCOUNTER — Other Ambulatory Visit: Payer: Self-pay | Admitting: Family Medicine

## 2022-06-20 ENCOUNTER — Encounter: Payer: Self-pay | Admitting: Family Medicine

## 2022-06-21 ENCOUNTER — Other Ambulatory Visit: Payer: Self-pay

## 2022-06-21 MED ORDER — AMLODIPINE BESYLATE 5 MG PO TABS: 5.0000 mg | ORAL_TABLET | Freq: Every day | ORAL | 0 refills | Status: DC

## 2022-06-21 MED ORDER — ALPRAZOLAM 0.5 MG PO TABS: 0.5000 mg | ORAL_TABLET | Freq: Two times a day (BID) | ORAL | 1 refills | Status: DC | PRN

## 2022-06-29 ENCOUNTER — Encounter: Payer: Self-pay | Admitting: Family Medicine

## 2022-06-30 ENCOUNTER — Other Ambulatory Visit: Payer: Self-pay

## 2022-08-29 ENCOUNTER — Other Ambulatory Visit: Payer: Self-pay | Admitting: Family Medicine

## 2022-09-13 ENCOUNTER — Encounter: Payer: Self-pay | Admitting: Family Medicine

## 2022-09-13 ENCOUNTER — Ambulatory Visit (INDEPENDENT_AMBULATORY_CARE_PROVIDER_SITE_OTHER): Payer: No Typology Code available for payment source | Admitting: Family Medicine

## 2022-09-13 VITALS — BP 138/84 | HR 73 | Temp 97.9°F | Resp 16 | Ht 64.0 in | Wt 215.4 lb

## 2022-09-13 DIAGNOSIS — Z6836 Body mass index (BMI) 36.0-36.9, adult: Secondary | ICD-10-CM

## 2022-09-13 DIAGNOSIS — F418 Other specified anxiety disorders: Secondary | ICD-10-CM | POA: Diagnosis not present

## 2022-09-13 DIAGNOSIS — E6609 Other obesity due to excess calories: Secondary | ICD-10-CM

## 2022-09-13 DIAGNOSIS — I10 Essential (primary) hypertension: Secondary | ICD-10-CM | POA: Diagnosis not present

## 2022-09-13 MED ORDER — AMLODIPINE BESYLATE 5 MG PO TABS: 5.0000 mg | ORAL_TABLET | Freq: Every day | ORAL | 0 refills | Status: DC

## 2022-09-13 MED ORDER — FLUOXETINE HCL 20 MG PO CAPS: ORAL_CAPSULE | ORAL | 1 refills | Status: DC

## 2022-09-13 MED ORDER — HYDROCHLOROTHIAZIDE 25 MG PO TABS
25.0000 mg | ORAL_TABLET | Freq: Every day | ORAL | 1 refills | Status: DC
Start: 2022-09-13 — End: 2023-03-07

## 2022-09-13 NOTE — Patient Instructions (Signed)
Schedule your complete physical in 6 months We'll notify you of your lab results and make any changes if needed Continue to work on healthy diet and regular exercise- you can do it!! Call with any questions or concerns Stay Safe!  Stay Healthy! Hang in there!!! 

## 2022-09-13 NOTE — Progress Notes (Signed)
   Subjective:    Patient ID: Pamela Clarke, female    DOB: Feb 21, 1976, 46 y.o.   MRN: [id number]  HPI HTN- chronic problem, on Amlodipine '5mg'$  daily and HCTZ '25mg'$  daily.  BP is mildly elevated today.  She reports it has been up and down b/c she has been under a lot of stress.  Has lost 4 people this year including her dad.  No CP, SOB, HAs, visual changes, edema.  Depression/Anxiety- chronic problem, on Fluoxetine '20mg'$  daily.  Pt is currently going through a divorce.    Obesity-pt has gained 15 lbs since last visit. Pt has gained the weight due to emotional eating.  Is aware of her weight gain and 'i'm gonna get it back together'.   Review of Systems For ROS see HPI     Objective:   Physical Exam Vitals reviewed.  Constitutional:      General: She is not in acute distress.    Appearance: Normal appearance. She is well-developed. She is obese. She is not ill-appearing.  HENT:     Head: Normocephalic and atraumatic.  Eyes:     Conjunctiva/sclera: Conjunctivae normal.     Pupils: Pupils are equal, round, and reactive to light.  Neck:     Thyroid: No thyromegaly.  Cardiovascular:     Rate and Rhythm: Normal rate and regular rhythm.     Pulses: Normal pulses.     Heart sounds: Normal heart sounds. No murmur heard. Pulmonary:     Effort: Pulmonary effort is normal. No respiratory distress.     Breath sounds: Normal breath sounds.  Abdominal:     General: There is no distension.     Palpations: Abdomen is soft.     Tenderness: There is no abdominal tenderness.  Musculoskeletal:     Cervical back: Normal range of motion and neck supple.     Right lower leg: No edema.     Left lower leg: No edema.  Lymphadenopathy:     Cervical: No cervical adenopathy.  Skin:    General: Skin is warm and dry.  Neurological:     General: No focal deficit present.     Mental Status: She is alert and oriented to person, place, and time.  Psychiatric:        Mood and Affect: Mood  normal.        Behavior: Behavior normal.        Thought Content: Thought content normal.          Assessment & Plan:

## 2022-09-14 LAB — LIPID PANEL
Cholesterol: 121 mg/dL (ref 0–200)
HDL: 62.7 mg/dL (ref >39.00)
LDL Cholesterol: 39 mg/dL (ref 0–99)
NonHDL: 57.91
Total CHOL/HDL Ratio: 2
Triglycerides: 93 mg/dL (ref 0.0–149.0)
VLDL: 18.6 mg/dL (ref 0.0–40.0)

## 2022-09-14 LAB — CBC WITH DIFFERENTIAL/PLATELET
Basophils Absolute: 0.1 10*3/uL (ref 0.0–0.1)
Basophils Relative: 1 % (ref 0.0–3.0)
Eosinophils Absolute: 0.1 10*3/uL (ref 0.0–0.7)
Eosinophils Relative: 1.3 % (ref 0.0–5.0)
HCT: 36.7 % (ref 36.0–46.0)
Hemoglobin: 11.8 g/dL — ABNORMAL LOW (ref 12.0–15.0)
Lymphocytes Relative: 29.5 % (ref 12.0–46.0)
Lymphs Abs: 3.2 10*3/uL (ref 0.7–4.0)
MCHC: 32.2 g/dL (ref 30.0–36.0)
MCV: 86.5 fl (ref 78.0–100.0)
Monocytes Absolute: 1.1 10*3/uL — ABNORMAL HIGH (ref 0.1–1.0)
Monocytes Relative: 10.1 % (ref 3.0–12.0)
Neutro Abs: 6.2 10*3/uL (ref 1.4–7.7)
Neutrophils Relative %: 58.1 % (ref 43.0–77.0)
Platelets: 502 10*3/uL — ABNORMAL HIGH (ref 150.0–400.0)
RBC: 4.24 Mil/uL (ref 3.87–5.11)
RDW: 12.2 % (ref 11.5–15.5)
WBC: 10.7 10*3/uL — ABNORMAL HIGH (ref 4.0–10.5)

## 2022-09-14 LAB — BASIC METABOLIC PANEL
BUN: 16 mg/dL (ref 6–23)
CO2: 29 mEq/L (ref 19–32)
Calcium: 9.5 mg/dL (ref 8.4–10.5)
Chloride: 99 mEq/L (ref 96–112)
Creatinine, Ser: 0.98 mg/dL (ref 0.40–1.20)
GFR: 69.48 mL/min (ref >60.00)
Glucose, Bld: 60 mg/dL — ABNORMAL LOW (ref 70–99)
Potassium: 3.5 mEq/L (ref 3.5–5.1)
Sodium: 138 mEq/L (ref 135–145)

## 2022-09-14 LAB — HEPATIC FUNCTION PANEL
ALT: 17 U/L (ref 0–35)
AST: 18 U/L (ref 0–37)
Albumin: 4.1 g/dL (ref 3.5–5.2)
Alkaline Phosphatase: 103 U/L (ref 39–117)
Bilirubin, Direct: 0.1 mg/dL (ref 0.0–0.3)
Total Bilirubin: 0.4 mg/dL (ref 0.2–1.2)
Total Protein: 8 g/dL (ref 6.0–8.3)

## 2022-09-14 LAB — VITAMIN D 25 HYDROXY (VIT D DEFICIENCY, FRACTURES): VITD: 48.66 ng/mL (ref 30.00–100.00)

## 2022-09-14 LAB — TSH: TSH: 0.95 u[IU]/mL (ref 0.35–5.50)

## 2022-09-14 NOTE — Progress Notes (Signed)
Informed pt of lab results  

## 2022-10-03 NOTE — Assessment & Plan Note (Signed)
Chronic problem.  Adequate but not ideal control today on Amlodipine '5mg'$  daily and HCTZ '25mg'$  daily.  Currently asymptomatic.  Check labs due to diuretic use.  No anticipated med changes.

## 2022-10-03 NOTE — Assessment & Plan Note (Signed)
Deteriorated.  Pt has gained 15 lbs since last visit.  She admits to emotional eating and is aware she gained weight.  She has plans to 'get it back together'- improve her eating and resume exercise.  Will check labs to risk stratify.  Will follow.

## 2022-10-03 NOTE — Assessment & Plan Note (Signed)
Ongoing issue for pt.  She has had a tough year- currently going through a divorce- but she feels that she is coming through it well and overall, doing ok.  No med changes at this time.  Will follow.

## 2022-11-22 ENCOUNTER — Encounter: Payer: Self-pay | Admitting: Family Medicine

## 2022-11-22 ENCOUNTER — Ambulatory Visit (INDEPENDENT_AMBULATORY_CARE_PROVIDER_SITE_OTHER): Payer: No Typology Code available for payment source | Admitting: Family Medicine

## 2022-11-22 VITALS — BP 120/86 | HR 87 | Temp 98.0°F | Resp 17 | Ht 64.0 in | Wt 208.2 lb

## 2022-11-22 DIAGNOSIS — J329 Chronic sinusitis, unspecified: Secondary | ICD-10-CM | POA: Diagnosis not present

## 2022-11-22 DIAGNOSIS — B9689 Other specified bacterial agents as the cause of diseases classified elsewhere: Secondary | ICD-10-CM

## 2022-11-22 DIAGNOSIS — R52 Pain, unspecified: Secondary | ICD-10-CM | POA: Diagnosis not present

## 2022-11-22 LAB — POCT INFLUENZA A/B
Influenza A, POC: NEGATIVE
Influenza B, POC: NEGATIVE

## 2022-11-22 LAB — POC COVID19 BINAXNOW: SARS Coronavirus 2 Ag: NEGATIVE

## 2022-11-22 MED ORDER — AMOXICILLIN 875 MG PO TABS: 875.0000 mg | ORAL_TABLET | Freq: Two times a day (BID) | ORAL | 0 refills | Status: AC

## 2022-11-22 MED ORDER — GUAIFENESIN-CODEINE 100-10 MG/5ML PO SYRP: 10.0000 mL | ORAL_SOLUTION | Freq: Three times a day (TID) | ORAL | 0 refills | Status: DC | PRN

## 2022-11-22 NOTE — Patient Instructions (Signed)
Follow up as needed or as scheduled START the Amoxicillin twice daily- take w/ food USE the cough syrup at night to help w/ both cough and rest Robitussin or Delsym or Mucinex DM to help w/ daytime cough Drink LOTS of fluids REST!!! Call with any questions or concerns Hang in there! Happy Holidays!!!

## 2022-11-22 NOTE — Progress Notes (Signed)
   Subjective:    Patient ID: Pamela Clarke, female    DOB: [birth year], 46 y.o.   MRN: [id number]  HPI Cough- sxs started ~1 week ago.  Completely lost voice until yesterday.  Yesterday developed fever and body aches.  Coughing so much that chest hurts.  Continues to have HA and facial pain.  No tooth pain.  No ear ache.  No sore throat.  No N/V/D.  Cough is starting to loosen.   Review of Systems For ROS see HPI     Objective:   Physical Exam Vitals reviewed.  Constitutional:      General: She is not in acute distress.    Appearance: Normal appearance. She is well-developed. She is not ill-appearing.  HENT:     Head: Normocephalic and atraumatic.     Right Ear: Tympanic membrane and ear canal normal.     Left Ear: Tympanic membrane and ear canal normal.     Nose: Mucosal edema and congestion present. No rhinorrhea.     Right Sinus: Maxillary sinus tenderness and frontal sinus tenderness present.     Left Sinus: Maxillary sinus tenderness and frontal sinus tenderness present.  Eyes:     Conjunctiva/sclera: Conjunctivae normal.     Pupils: Pupils are equal, round, and reactive to light.  Cardiovascular:     Rate and Rhythm: Normal rate and regular rhythm.     Heart sounds: Normal heart sounds.  Pulmonary:     Effort: Pulmonary effort is normal. No respiratory distress.     Breath sounds: Normal breath sounds. No wheezing.  Musculoskeletal:     Cervical back: Normal range of motion and neck supple.  Lymphadenopathy:     Cervical: No cervical adenopathy.  Skin:    General: Skin is warm and dry.  Neurological:     General: No focal deficit present.     Mental Status: She is alert and oriented to person, place, and time.     Cranial Nerves: No cranial nerve deficit.     Motor: No weakness.     Coordination: Coordination normal.  Psychiatric:        Mood and Affect: Mood normal.        Behavior: Behavior normal.        Thought Content: Thought content normal.            Assessment & Plan:   Bacterial sinusitis- new.  Sxs are consistent w/ likely secondary bacterial infxn after initial sxs started over a week ago.  Acutely worsened yesterday.  Negative for flu and COVID.  Start Amoxicillin '875mg'$  BID and cough syrup prn.  Reviewed supportive care and red flags that should prompt return.  Pt expressed understanding and is in agreement w/ plan.

## 2022-12-05 ENCOUNTER — Other Ambulatory Visit: Payer: Self-pay | Admitting: Family Medicine

## 2023-03-07 ENCOUNTER — Other Ambulatory Visit: Payer: Self-pay | Admitting: Family Medicine

## 2023-03-08 ENCOUNTER — Other Ambulatory Visit: Payer: Self-pay | Admitting: Family Medicine

## 2023-03-15 ENCOUNTER — Encounter: Payer: Self-pay | Admitting: Family Medicine

## 2023-03-15 ENCOUNTER — Ambulatory Visit (INDEPENDENT_AMBULATORY_CARE_PROVIDER_SITE_OTHER): Payer: No Typology Code available for payment source | Admitting: Family Medicine

## 2023-03-15 VITALS — BP 118/78 | HR 67 | Temp 98.0°F | Ht 64.0 in | Wt 215.1 lb

## 2023-03-15 DIAGNOSIS — E559 Vitamin D deficiency, unspecified: Secondary | ICD-10-CM

## 2023-03-15 DIAGNOSIS — Z1211 Encounter for screening for malignant neoplasm of colon: Secondary | ICD-10-CM

## 2023-03-15 DIAGNOSIS — Z0000 Encounter for general adult medical examination without abnormal findings: Secondary | ICD-10-CM

## 2023-03-15 DIAGNOSIS — I10 Essential (primary) hypertension: Secondary | ICD-10-CM | POA: Diagnosis not present

## 2023-03-15 DIAGNOSIS — F418 Other specified anxiety disorders: Secondary | ICD-10-CM

## 2023-03-15 LAB — HEPATIC FUNCTION PANEL
ALT: 18 U/L (ref 0–35)
AST: 20 U/L (ref 0–37)
Albumin: 4.2 g/dL (ref 3.5–5.2)
Alkaline Phosphatase: 122 U/L — ABNORMAL HIGH (ref 39–117)
Bilirubin, Direct: 0.1 mg/dL (ref 0.0–0.3)
Total Bilirubin: 0.6 mg/dL (ref 0.2–1.2)
Total Protein: 7.7 g/dL (ref 6.0–8.3)

## 2023-03-15 LAB — BASIC METABOLIC PANEL
BUN: 14 mg/dL (ref 6–23)
CO2: 29 mEq/L (ref 19–32)
Calcium: 9.6 mg/dL (ref 8.4–10.5)
Chloride: 100 mEq/L (ref 96–112)
Creatinine, Ser: 0.89 mg/dL (ref 0.40–1.20)
GFR: 77.72 mL/min (ref >60.00)
Glucose, Bld: 100 mg/dL — ABNORMAL HIGH (ref 70–99)
Potassium: 3.4 mEq/L — ABNORMAL LOW (ref 3.5–5.1)
Sodium: 138 mEq/L (ref 135–145)

## 2023-03-15 LAB — CBC WITH DIFFERENTIAL/PLATELET
Basophils Absolute: 0 10*3/uL (ref 0.0–0.1)
Basophils Relative: 0.3 % (ref 0.0–3.0)
Eosinophils Absolute: 0.2 10*3/uL (ref 0.0–0.7)
Eosinophils Relative: 1.3 % (ref 0.0–5.0)
HCT: 37.8 % (ref 36.0–46.0)
Hemoglobin: 12 g/dL (ref 12.0–15.0)
Lymphocytes Relative: 18.8 % (ref 12.0–46.0)
Lymphs Abs: 2.4 10*3/uL (ref 0.7–4.0)
MCHC: 31.8 g/dL (ref 30.0–36.0)
MCV: 85.9 fl (ref 78.0–100.0)
Monocytes Absolute: 0.8 10*3/uL (ref 0.1–1.0)
Monocytes Relative: 6 % (ref 3.0–12.0)
Neutro Abs: 9.3 10*3/uL — ABNORMAL HIGH (ref 1.4–7.7)
Neutrophils Relative %: 73.6 % (ref 43.0–77.0)
Platelets: 473 10*3/uL — ABNORMAL HIGH (ref 150.0–400.0)
RBC: 4.4 Mil/uL (ref 3.87–5.11)
RDW: 12.6 % (ref 11.5–15.5)
WBC: 12.6 10*3/uL — ABNORMAL HIGH (ref 4.0–10.5)

## 2023-03-15 LAB — LIPID PANEL
Cholesterol: 112 mg/dL (ref 0–200)
HDL: 55.4 mg/dL (ref >39.00)
LDL Cholesterol: 44 mg/dL (ref 0–99)
NonHDL: 56.66
Total CHOL/HDL Ratio: 2
Triglycerides: 64 mg/dL (ref 0.0–149.0)
VLDL: 12.8 mg/dL (ref 0.0–40.0)

## 2023-03-15 LAB — VITAMIN D 25 HYDROXY (VIT D DEFICIENCY, FRACTURES): VITD: 38.31 ng/mL (ref 30.00–100.00)

## 2023-03-15 LAB — TSH: TSH: 2.13 u[IU]/mL (ref 0.35–5.50)

## 2023-03-15 NOTE — Assessment & Plan Note (Signed)
Check labs and replete prn. 

## 2023-03-15 NOTE — Assessment & Plan Note (Signed)
Pt's PE WNL w/ exception of BMI.  UTD on pap, mammo, immunizations.  Due for colonoscopy- referral placed.  Check labs.  Anticipatory guidance provided.

## 2023-03-15 NOTE — Assessment & Plan Note (Signed)
Chronic problem.  Excellent control.  Currently asymptomatic.  Check labs.  No anticipated med changes.  Will follow. 

## 2023-03-15 NOTE — Patient Instructions (Signed)
Follow up in 6 months to recheck BP We'll notify you of your lab results and make any changes if needed Keep up the good work on healthy diet and regular exercise- you're doing great! We'll call you to schedule your GI appt Increase the Fluoxetine to 2 tabs daily (40mg  daily) Call with any questions or concerns Hang in there!!!

## 2023-03-15 NOTE — Progress Notes (Signed)
   Subjective:    Patient ID: Pamela Clarke, female    DOB: 1976-01-06, 47 y.o.   MRN: [id number]  HPI CPE- due for colonoscopy.  UTD on pap, mammo, Tdap  Patient Care Team    Relationship Specialty Notifications Start End  Sheliah Hatch, MD PCP - General Family Medicine  08/22/12   Noland Fordyce, MD Consulting Physician Obstetrics and Gynecology  09/03/18      Health Maintenance  Topic Date Due   COLONOSCOPY (Pts 45-33yrs Insurance coverage will need to be confirmed)  10/22/2022   PAP SMEAR-Modifier  07/07/2024   DTaP/Tdap/Td (2 - Td or Tdap) 08/17/2025   Hepatitis C Screening  Completed   HIV Screening  Completed   HPV VACCINES  Aged Out   COVID-19 Vaccine  Discontinued    Review of Systems Patient reports no vision/ hearing changes, adenopathy,fever, weight change,  persistant/recurrent hoarseness , swallowing issues, chest pain, palpitations, edema, persistant/recurrent cough, hemoptysis, dyspnea (rest/exertional/paroxysmal nocturnal), gastrointestinal bleeding (melena, rectal bleeding), abdominal pain, significant heartburn, bowel changes, GU symptoms (dysuria, hematuria, incontinence), Gyn symptoms (abnormal  bleeding, pain),  syncope, focal weakness, memory loss, numbness & tingling, skin/hair/nail changes, abnormal bruising or bleeding.     Objective:   Physical Exam General Appearance:    Alert, cooperative, no distress, appears stated age, obese  Head:    Normocephalic, without obvious abnormality, atraumatic  Eyes:    PERRL, conjunctiva/corneas clear, EOM's intact both eyes  Ears:    Normal TM's and external ear canals, both ears  Nose:   Nares normal, septum midline, mucosa normal, no drainage    or sinus tenderness  Throat:   Lips, mucosa, and tongue normal; teeth and gums normal  Neck:   Supple, symmetrical, trachea midline, no adenopathy;    Thyroid: no enlargement/tenderness/nodules  Back:     Symmetric, no curvature, ROM normal, no CVA tenderness   Lungs:     Clear to auscultation bilaterally, respirations unlabored  Chest Wall:    No tenderness or deformity   Heart:    Regular rate and rhythm, S1 and S2 normal, no murmur, rub   or gallop  Breast Exam:    Deferred to GYN  Abdomen:     Soft, non-tender, bowel sounds active all four quadrants,    no masses, no organomegaly  Genitalia:    Deferred to GYN  Rectal:    Extremities:   Extremities normal, atraumatic, no cyanosis or edema  Pulses:   2+ and symmetric all extremities  Skin:   Skin color, texture, turgor normal, no rashes or lesions  Lymph nodes:   Cervical, supraclavicular, and axillary nodes normal  Neurologic:   CNII-XII intact, normal strength, sensation and reflexes    throughout          Assessment & Plan:

## 2023-03-15 NOTE — Assessment & Plan Note (Signed)
Ongoing issue.  Over the last year pt has experienced quite a bit of loss.  Her boss died this week and they were very close.  Will increase fluoxetine to 40mg  daily.  Pt expressed understanding and is in agreement w/ plan.

## 2023-03-16 ENCOUNTER — Other Ambulatory Visit: Payer: Self-pay

## 2023-03-16 ENCOUNTER — Telehealth: Payer: Self-pay

## 2023-03-16 ENCOUNTER — Other Ambulatory Visit (INDEPENDENT_AMBULATORY_CARE_PROVIDER_SITE_OTHER): Payer: No Typology Code available for payment source

## 2023-03-16 ENCOUNTER — Other Ambulatory Visit: Payer: No Typology Code available for payment source

## 2023-03-16 DIAGNOSIS — D72829 Elevated white blood cell count, unspecified: Secondary | ICD-10-CM

## 2023-03-16 DIAGNOSIS — R748 Abnormal levels of other serum enzymes: Secondary | ICD-10-CM | POA: Diagnosis not present

## 2023-03-16 LAB — GAMMA GT: GGT: 29 U/L (ref 7–51)

## 2023-03-16 NOTE — Telephone Encounter (Signed)
Pt aware of results . Lab only visit has been made . GGT add on has been faxed to lab and repeat CBC is in .

## 2023-03-16 NOTE — Telephone Encounter (Signed)
-----   Message from Pamela Hatch, MD sent at 03/16/2023 10:36 AM EDT ----- Labs are stable and look good w/ a few small exceptions  - your alk phos is mildly elevated.  We will add a GGT (dx elevated alk phos) to determine if this is coming from your liver or another source  - your WBC (white blood cell count) is somewhat elevated.  This can be a marker of inflammation/infection and is typically not anything to worry about but we will recheck your CBC at a lab only visit in 1-2 weeks (dx elevated WBC)  Remainder of labs look great!

## 2023-03-19 ENCOUNTER — Telehealth: Payer: Self-pay

## 2023-03-19 NOTE — Telephone Encounter (Signed)
Pt aware of lab results 

## 2023-03-19 NOTE — Telephone Encounter (Signed)
-----   Message from Sheliah Hatch, MD sent at 03/19/2023  7:22 AM EDT ----- No evidence of liver issue based on GGT- great news!

## 2023-03-29 ENCOUNTER — Other Ambulatory Visit: Payer: No Typology Code available for payment source

## 2023-03-30 ENCOUNTER — Other Ambulatory Visit (INDEPENDENT_AMBULATORY_CARE_PROVIDER_SITE_OTHER): Payer: No Typology Code available for payment source

## 2023-03-30 DIAGNOSIS — D72829 Elevated white blood cell count, unspecified: Secondary | ICD-10-CM | POA: Diagnosis not present

## 2023-03-30 LAB — CBC WITH DIFFERENTIAL/PLATELET
Basophils Absolute: 0.1 10*3/uL (ref 0.0–0.1)
Basophils Relative: 0.6 % (ref 0.0–3.0)
Eosinophils Absolute: 0.2 10*3/uL (ref 0.0–0.7)
Eosinophils Relative: 1.5 % (ref 0.0–5.0)
HCT: 35 % — ABNORMAL LOW (ref 36.0–46.0)
Hemoglobin: 11.6 g/dL — ABNORMAL LOW (ref 12.0–15.0)
Lymphocytes Relative: 28.8 % (ref 12.0–46.0)
Lymphs Abs: 3 10*3/uL (ref 0.7–4.0)
MCHC: 33 g/dL (ref 30.0–36.0)
MCV: 85.5 fl (ref 78.0–100.0)
Monocytes Absolute: 0.7 10*3/uL (ref 0.1–1.0)
Monocytes Relative: 6.3 % (ref 3.0–12.0)
Neutro Abs: 6.6 10*3/uL (ref 1.4–7.7)
Neutrophils Relative %: 62.8 % (ref 43.0–77.0)
Platelets: 487 10*3/uL — ABNORMAL HIGH (ref 150.0–400.0)
RBC: 4.09 Mil/uL (ref 3.87–5.11)
RDW: 12.6 % (ref 11.5–15.5)
WBC: 10.5 10*3/uL (ref 4.0–10.5)

## 2023-04-02 ENCOUNTER — Telehealth: Payer: Self-pay

## 2023-04-02 NOTE — Telephone Encounter (Signed)
-----   Message from Sheliah Hatch, MD sent at 04/02/2023  7:31 AM EDT ----- Normal white blood cell count- this is great news!!!

## 2023-04-02 NOTE — Telephone Encounter (Signed)
Left lab results on VM 

## 2023-06-04 ENCOUNTER — Other Ambulatory Visit: Payer: Self-pay | Admitting: Family Medicine

## 2023-08-31 ENCOUNTER — Other Ambulatory Visit: Payer: Self-pay | Admitting: Family Medicine

## 2023-09-01 ENCOUNTER — Other Ambulatory Visit: Payer: Self-pay | Admitting: Family Medicine

## 2023-09-14 ENCOUNTER — Encounter: Payer: Self-pay | Admitting: Family Medicine

## 2023-09-14 ENCOUNTER — Ambulatory Visit: Payer: No Typology Code available for payment source | Admitting: Family Medicine

## 2023-09-14 VITALS — BP 112/74 | HR 73 | Temp 97.8°F | Ht 64.0 in | Wt 222.2 lb

## 2023-09-14 DIAGNOSIS — I10 Essential (primary) hypertension: Secondary | ICD-10-CM | POA: Diagnosis not present

## 2023-09-14 DIAGNOSIS — Z6838 Body mass index (BMI) 38.0-38.9, adult: Secondary | ICD-10-CM

## 2023-09-14 DIAGNOSIS — E6609 Other obesity due to excess calories: Secondary | ICD-10-CM | POA: Diagnosis not present

## 2023-09-14 DIAGNOSIS — E66812 Obesity, class 2: Secondary | ICD-10-CM | POA: Diagnosis not present

## 2023-09-14 LAB — CBC WITH DIFFERENTIAL/PLATELET
Basophils Absolute: 0 10*3/uL (ref 0.0–0.1)
Basophils Relative: 0.2 % (ref 0.0–3.0)
Eosinophils Absolute: 0.1 10*3/uL (ref 0.0–0.7)
Eosinophils Relative: 0.8 % (ref 0.0–5.0)
HCT: 36.3 % (ref 36.0–46.0)
Hemoglobin: 11.2 g/dL — ABNORMAL LOW (ref 12.0–15.0)
Lymphocytes Relative: 15.5 % (ref 12.0–46.0)
Lymphs Abs: 2.5 10*3/uL (ref 0.7–4.0)
MCHC: 31 g/dL (ref 30.0–36.0)
MCV: 86.7 fL (ref 78.0–100.0)
Monocytes Absolute: 0.8 10*3/uL (ref 0.1–1.0)
Monocytes Relative: 4.9 % (ref 3.0–12.0)
Neutro Abs: 12.8 10*3/uL — ABNORMAL HIGH (ref 1.4–7.7)
Neutrophils Relative %: 78.6 % — ABNORMAL HIGH (ref 43.0–77.0)
Platelets: 515 10*3/uL — ABNORMAL HIGH (ref 150.0–400.0)
RBC: 4.18 Mil/uL (ref 3.87–5.11)
RDW: 13.5 % (ref 11.5–15.5)
WBC: 16.3 10*3/uL — ABNORMAL HIGH (ref 4.0–10.5)

## 2023-09-14 LAB — LIPID PANEL
Cholesterol: 134 mg/dL (ref 0–200)
HDL: 68.9 mg/dL (ref >39.00)
LDL Cholesterol: 55 mg/dL (ref 0–99)
NonHDL: 65.15
Total CHOL/HDL Ratio: 2
Triglycerides: 50 mg/dL (ref 0.0–149.0)
VLDL: 10 mg/dL (ref 0.0–40.0)

## 2023-09-14 LAB — BASIC METABOLIC PANEL
BUN: 17 mg/dL (ref 6–23)
CO2: 32 meq/L (ref 19–32)
Calcium: 9.8 mg/dL (ref 8.4–10.5)
Chloride: 99 meq/L (ref 96–112)
Creatinine, Ser: 0.97 mg/dL (ref 0.40–1.20)
GFR: 69.85 mL/min (ref >60.00)
Glucose, Bld: 95 mg/dL (ref 70–99)
Potassium: 3.7 meq/L (ref 3.5–5.1)
Sodium: 141 meq/L (ref 135–145)

## 2023-09-14 LAB — HEPATIC FUNCTION PANEL
ALT: 31 U/L (ref 0–35)
AST: 34 U/L (ref 0–37)
Albumin: 4.1 g/dL (ref 3.5–5.2)
Alkaline Phosphatase: 137 U/L — ABNORMAL HIGH (ref 39–117)
Bilirubin, Direct: 0.2 mg/dL (ref 0.0–0.3)
Total Bilirubin: 0.8 mg/dL (ref 0.2–1.2)
Total Protein: 6.8 g/dL (ref 6.0–8.3)

## 2023-09-14 LAB — TSH: TSH: 2.45 u[IU]/mL (ref 0.35–5.50)

## 2023-09-14 NOTE — Assessment & Plan Note (Signed)
Deteriorated.  Pt has gained 7 lbs since her last visit.  She admits to poor diet and limited exercise.  She started ref'ing basketball again this week and will be much more active in the coming months.  Will follow.

## 2023-09-14 NOTE — Progress Notes (Signed)
   Subjective:    Patient ID: Pamela Clarke, female    DOB: October 13, 1976, 47 y.o.   MRN: [id number]  HPI HTN- chronic problem, on Amlodipine 5mg  daily and hydrochlorothiazide 25mg  daily.  BP is well controlled today.  No CP, SOB, HA"s, visual changes, edema.  Obesity- pt has gained 7 lbs since last visit.  BMI now 38.14  Pt reports eating has not been good recently.  Now ref'ing basketball again.     Review of Systems For ROS see HPI     Objective:   Physical Exam Vitals reviewed.  Constitutional:      General: She is not in acute distress.    Appearance: Normal appearance. She is well-developed. She is not ill-appearing.  HENT:     Head: Normocephalic and atraumatic.  Eyes:     Conjunctiva/sclera: Conjunctivae normal.     Pupils: Pupils are equal, round, and reactive to light.  Neck:     Thyroid: No thyromegaly.  Cardiovascular:     Rate and Rhythm: Normal rate and regular rhythm.     Pulses: Normal pulses.     Heart sounds: Normal heart sounds. No murmur heard. Pulmonary:     Effort: Pulmonary effort is normal. No respiratory distress.     Breath sounds: Normal breath sounds.  Abdominal:     General: There is no distension.     Palpations: Abdomen is soft.     Tenderness: There is no abdominal tenderness.  Musculoskeletal:     Cervical back: Normal range of motion and neck supple.     Right lower leg: No edema.     Left lower leg: No edema.  Lymphadenopathy:     Cervical: No cervical adenopathy.  Skin:    General: Skin is warm and dry.  Neurological:     General: No focal deficit present.     Mental Status: She is alert and oriented to person, place, and time.  Psychiatric:        Behavior: Behavior normal.           Assessment & Plan:

## 2023-09-14 NOTE — Patient Instructions (Signed)
Schedule your complete physical in 6 months We'll notify you of your lab results and make any changes if needed Continue to work on healthy diet and regular exercise- you can do it! Call with any questions or concerns Stay Safe!  Stay Healthy! Happy Early Birthday!!! 

## 2023-09-14 NOTE — Assessment & Plan Note (Signed)
Chronic problem.  Excellent control today on Amlodipine and hydrochlorothiazide.  Currently asymptomatic.  Check labs.  Anticipatory guidance provided.

## 2023-09-18 ENCOUNTER — Telehealth: Payer: Self-pay

## 2023-09-18 DIAGNOSIS — D72829 Elevated white blood cell count, unspecified: Secondary | ICD-10-CM

## 2023-09-18 DIAGNOSIS — R748 Abnormal levels of other serum enzymes: Secondary | ICD-10-CM

## 2023-09-18 NOTE — Telephone Encounter (Signed)
Message left for patient to return my call. Please have a clinical person give her these lab results when she returns the call. Lab visit needs to be scheduled.

## 2023-09-18 NOTE — Telephone Encounter (Signed)
-----   Message from Neena Rhymes sent at 09/18/2023  7:35 AM EDT ----- Labs look great w/ 2 exceptions-  1) your Alk Phos has gone up again.  It was much higher in 2021 but then has been normal.  We will repeat your liver functions at a lab only visit in 2 weeks (dx elevated alk phos)  2) your white blood cell count is up.  Typically this indicates inflammation or infection.  It can also be seen with recent steroid (prednisone) use.  Please let me know if you have developed any symptoms since our visit a few days ago (when you were feeling well).  If things are still good, sometimes these are lab errors.  We will repeat your CBC w/ diff at the same lab only visit in 2 weeks (dx elevated WBC)

## 2023-09-18 NOTE — Telephone Encounter (Signed)
Patient returned call and is aware of her lab results, she has no questions at this time. Labs are future ordered and lab visit is scheduled for two weeks.   Patient states that she has been feeling fine since her last visit.

## 2023-09-21 ENCOUNTER — Other Ambulatory Visit: Payer: Self-pay | Admitting: Family Medicine

## 2023-09-21 NOTE — Telephone Encounter (Signed)
Patient is requesting a refill of the following medications: Requested Prescriptions   Pending Prescriptions Disp Refills   ALPRAZolam (XANAX) 0.5 MG tablet [Pharmacy Med Name: ALPRAZOLAM 0.5 MG TABLET] 30 tablet     Sig: TAKE 1 TABLET BY MOUTH 2 TIMES DAILY AS NEEDED FOR ANXIETY.    Date of patient request: 09/21/23 Last office visit: 09/14/23 Date of last refill: 06/21/2022 Last refill amount: 30

## 2023-09-21 NOTE — Telephone Encounter (Signed)
Pt has been notified.

## 2023-10-02 ENCOUNTER — Other Ambulatory Visit (INDEPENDENT_AMBULATORY_CARE_PROVIDER_SITE_OTHER): Payer: No Typology Code available for payment source

## 2023-10-02 DIAGNOSIS — R748 Abnormal levels of other serum enzymes: Secondary | ICD-10-CM | POA: Diagnosis not present

## 2023-10-02 DIAGNOSIS — D72829 Elevated white blood cell count, unspecified: Secondary | ICD-10-CM

## 2023-10-02 LAB — CBC WITH DIFFERENTIAL/PLATELET
Basophils Absolute: 0 10*3/uL (ref 0.0–0.1)
Basophils Relative: 0.3 % (ref 0.0–3.0)
Eosinophils Absolute: 0.1 10*3/uL (ref 0.0–0.7)
Eosinophils Relative: 1.1 % (ref 0.0–5.0)
HCT: 37.1 % (ref 36.0–46.0)
Hemoglobin: 11.6 g/dL — ABNORMAL LOW (ref 12.0–15.0)
Lymphocytes Relative: 21.5 % (ref 12.0–46.0)
Lymphs Abs: 2.4 10*3/uL (ref 0.7–4.0)
MCHC: 31.2 g/dL (ref 30.0–36.0)
MCV: 87.2 fL (ref 78.0–100.0)
Monocytes Absolute: 0.9 10*3/uL (ref 0.1–1.0)
Monocytes Relative: 7.7 % (ref 3.0–12.0)
Neutro Abs: 7.9 10*3/uL — ABNORMAL HIGH (ref 1.4–7.7)
Neutrophils Relative %: 69.4 % (ref 43.0–77.0)
Platelets: 486 10*3/uL — ABNORMAL HIGH (ref 150.0–400.0)
RBC: 4.25 Mil/uL (ref 3.87–5.11)
RDW: 13.4 % (ref 11.5–15.5)
WBC: 11.3 10*3/uL — ABNORMAL HIGH (ref 4.0–10.5)

## 2023-10-02 LAB — HEPATIC FUNCTION PANEL
ALT: 20 U/L (ref 0–35)
AST: 21 U/L (ref 0–37)
Albumin: 4 g/dL (ref 3.5–5.2)
Alkaline Phosphatase: 110 U/L (ref 39–117)
Bilirubin, Direct: 0.1 mg/dL (ref 0.0–0.3)
Total Bilirubin: 0.6 mg/dL (ref 0.2–1.2)
Total Protein: 7.3 g/dL (ref 6.0–8.3)

## 2023-10-03 ENCOUNTER — Telehealth: Payer: Self-pay

## 2023-10-03 NOTE — Telephone Encounter (Signed)
-----   Message from Neena Rhymes sent at 10/02/2023  8:39 PM EDT ----- WBC looks MUCH better!  Liver functions have also returned to normal.  No changes at this time

## 2023-11-29 ENCOUNTER — Other Ambulatory Visit: Payer: Self-pay | Admitting: Family Medicine

## 2024-02-20 ENCOUNTER — Other Ambulatory Visit: Payer: Self-pay | Admitting: Family Medicine

## 2024-03-02 ENCOUNTER — Other Ambulatory Visit: Payer: Self-pay | Admitting: Family Medicine

## 2024-03-18 ENCOUNTER — Encounter: Payer: No Typology Code available for payment source | Admitting: Family Medicine

## 2024-04-01 ENCOUNTER — Encounter: Payer: Self-pay | Admitting: Family Medicine

## 2024-07-03 ENCOUNTER — Other Ambulatory Visit: Payer: Self-pay | Admitting: Family Medicine

## 2024-08-27 ENCOUNTER — Other Ambulatory Visit: Payer: Self-pay | Admitting: Family Medicine

## 2024-08-27 NOTE — Telephone Encounter (Signed)
 Appt has been made, Rx sent

## 2024-08-27 NOTE — Telephone Encounter (Signed)
 Pt needs appt. Will call pt today

## 2024-09-04 ENCOUNTER — Ambulatory Visit: Admitting: Family Medicine

## 2024-09-04 ENCOUNTER — Encounter: Payer: Self-pay | Admitting: Family Medicine

## 2024-09-04 VITALS — BP 120/80 | HR 76 | Temp 98.3°F | Wt 230.4 lb

## 2024-09-04 DIAGNOSIS — E6609 Other obesity due to excess calories: Secondary | ICD-10-CM

## 2024-09-04 DIAGNOSIS — E66812 Obesity, class 2: Secondary | ICD-10-CM

## 2024-09-04 DIAGNOSIS — I10 Essential (primary) hypertension: Secondary | ICD-10-CM | POA: Diagnosis not present

## 2024-09-04 DIAGNOSIS — Z6839 Body mass index (BMI) 39.0-39.9, adult: Secondary | ICD-10-CM | POA: Diagnosis not present

## 2024-09-04 LAB — CBC WITH DIFFERENTIAL/PLATELET
Basophils Absolute: 0.1 K/uL (ref 0.0–0.1)
Basophils Relative: 0.6 % (ref 0.0–3.0)
Eosinophils Absolute: 0.1 K/uL (ref 0.0–0.7)
Eosinophils Relative: 1.3 % (ref 0.0–5.0)
HCT: 34.5 % — ABNORMAL LOW (ref 36.0–46.0)
Hemoglobin: 11 g/dL — ABNORMAL LOW (ref 12.0–15.0)
Lymphocytes Relative: 23 % (ref 12.0–46.0)
Lymphs Abs: 2.2 K/uL (ref 0.7–4.0)
MCHC: 31.8 g/dL (ref 30.0–36.0)
MCV: 85.5 fl (ref 78.0–100.0)
Monocytes Absolute: 0.7 K/uL (ref 0.1–1.0)
Monocytes Relative: 7.5 % (ref 3.0–12.0)
Neutro Abs: 6.4 K/uL (ref 1.4–7.7)
Neutrophils Relative %: 67.6 % (ref 43.0–77.0)
Platelets: 466 K/uL — ABNORMAL HIGH (ref 150.0–400.0)
RBC: 4.04 Mil/uL (ref 3.87–5.11)
RDW: 13.1 % (ref 11.5–15.5)
WBC: 9.4 K/uL (ref 4.0–10.5)

## 2024-09-04 LAB — HEPATIC FUNCTION PANEL
ALT: 15 U/L (ref 0–35)
AST: 20 U/L (ref 0–37)
Albumin: 4.2 g/dL (ref 3.5–5.2)
Alkaline Phosphatase: 94 U/L (ref 39–117)
Bilirubin, Direct: 0.1 mg/dL (ref 0.0–0.3)
Total Bilirubin: 0.5 mg/dL (ref 0.2–1.2)
Total Protein: 8 g/dL (ref 6.0–8.3)

## 2024-09-04 LAB — BASIC METABOLIC PANEL WITH GFR
BUN: 21 mg/dL (ref 6–23)
CO2: 25 meq/L (ref 19–32)
Calcium: 9.6 mg/dL (ref 8.4–10.5)
Chloride: 105 meq/L (ref 96–112)
Creatinine, Ser: 0.84 mg/dL (ref 0.40–1.20)
GFR: 82.45 mL/min (ref >60.00)
Glucose, Bld: 78 mg/dL (ref 70–99)
Potassium: 4.4 meq/L (ref 3.5–5.1)
Sodium: 138 meq/L (ref 135–145)

## 2024-09-04 LAB — LIPID PANEL
Cholesterol: 105 mg/dL (ref 0–200)
HDL: 50.1 mg/dL (ref >39.00)
LDL Cholesterol: 42 mg/dL (ref 0–99)
NonHDL: 54.64
Total CHOL/HDL Ratio: 2
Triglycerides: 62 mg/dL (ref 0.0–149.0)
VLDL: 12.4 mg/dL (ref 0.0–40.0)

## 2024-09-04 LAB — TSH: TSH: 1.35 u[IU]/mL (ref 0.35–5.50)

## 2024-09-04 MED ORDER — PHENTERMINE HCL 37.5 MG PO TABS: 37.5000 mg | ORAL_TABLET | Freq: Every day | ORAL | 0 refills | Status: DC

## 2024-09-04 NOTE — Assessment & Plan Note (Signed)
 Deteriorated.  Pt was initially interested in GLP1 but started low carb diet last week and wants to try something more short term.  Will start Phentermine x90 days to try and jump start metabolism in addition to  her low carb diet and regula physical activity.  Pt expressed understanding and is in agreement w/ plan.

## 2024-09-04 NOTE — Patient Instructions (Signed)
 Follow up in 6 weeks to recheck weight loss progress We'll notify you of your lab results and make any changes if needed START the Phentermine once daily for appetite and weight Continue to work on low carb diet and regular exercise- you can do it! Call with any questions or concerns Stay Safe! Stay Healthy! Happy Fall!!!

## 2024-09-04 NOTE — Progress Notes (Signed)
   Subjective:    Patient ID: Pamela Clarke, female    DOB: 1976-11-23, 48 y.o.   MRN: [id number]  HPI HTN- chronic problem.  On Amlodipine  5mg  daily and hydrochlorothiazide  25mg  daily w/ good control.  Denies CP, SOB, HA's, visual changes, edema.  Obesity- ongoing issue.  Pt has gained 8 lbs since last year.  Pt is frustrated.  Pt reports that in the last week has started a 'very low carb diet'.  Interested in a 'kick start' for metabolism   Review of Systems For ROS see HPI     Objective:   Physical Exam Vitals reviewed.  Constitutional:      General: She is not in acute distress.    Appearance: Normal appearance. She is well-developed. She is not ill-appearing.  HENT:     Head: Normocephalic and atraumatic.  Eyes:     Conjunctiva/sclera: Conjunctivae normal.     Pupils: Pupils are equal, round, and reactive to light.  Neck:     Thyroid : No thyromegaly.  Cardiovascular:     Rate and Rhythm: Normal rate and regular rhythm.     Pulses: Normal pulses.     Heart sounds: Normal heart sounds. No murmur heard. Pulmonary:     Effort: Pulmonary effort is normal. No respiratory distress.     Breath sounds: Normal breath sounds.  Abdominal:     General: There is no distension.     Palpations: Abdomen is soft.     Tenderness: There is no abdominal tenderness.  Musculoskeletal:     Cervical back: Normal range of motion and neck supple.     Right lower leg: No edema.     Left lower leg: No edema.  Lymphadenopathy:     Cervical: No cervical adenopathy.  Skin:    General: Skin is warm and dry.  Neurological:     General: No focal deficit present.     Mental Status: She is alert and oriented to person, place, and time.  Psychiatric:        Mood and Affect: Mood normal.        Behavior: Behavior normal.        Thought Content: Thought content normal.           Assessment & Plan:

## 2024-09-04 NOTE — Assessment & Plan Note (Signed)
 Chronic problem.  Well controlled on Amlodipine  and hydrochlorothiazide  daily.  Currently asymptomatic.  Check labs due to diuretic use but no anticipated med changes.

## 2024-09-05 ENCOUNTER — Other Ambulatory Visit (HOSPITAL_COMMUNITY): Payer: Self-pay

## 2024-09-05 ENCOUNTER — Telehealth: Payer: Self-pay

## 2024-09-05 ENCOUNTER — Ambulatory Visit: Payer: Self-pay | Admitting: Family Medicine

## 2024-09-05 NOTE — Telephone Encounter (Signed)
 Pharmacy Patient Advocate Encounter   Received notification from Onbase that prior authorization for Phentermine HCl 37.5MG  tablets is required/requested.   Insurance verification completed.   The patient is insured through [facility].   Per test claim: PA required; PA submitted to above mentioned insurance via Latent Key/confirmation #/EOC [id number] Status is pending

## 2024-09-05 NOTE — Progress Notes (Signed)
 Lab results have been discussed.   Verbalized understanding? Yes  Are there any questions? No

## 2024-09-06 ENCOUNTER — Other Ambulatory Visit (HOSPITAL_COMMUNITY): Payer: Self-pay

## 2024-09-08 ENCOUNTER — Other Ambulatory Visit: Payer: Self-pay

## 2024-09-08 ENCOUNTER — Other Ambulatory Visit (HOSPITAL_COMMUNITY): Payer: Self-pay

## 2024-09-08 MED ORDER — PHENTERMINE HCL 37.5 MG PO TABS: 37.5000 mg | ORAL_TABLET | Freq: Every day | ORAL | 0 refills | Status: DC

## 2024-09-08 NOTE — Telephone Encounter (Signed)
 Pharmacy Patient Advocate Encounter  Received notification from [facility] that Prior Authorization for Phentermine HCl 37.5MG  tablets  has been APPROVED from 09/05/24 to 12/06/24   PA #/Case ID/Reference #: [id number]

## 2024-10-11 ENCOUNTER — Other Ambulatory Visit: Payer: Self-pay | Admitting: Family Medicine

## 2024-10-17 ENCOUNTER — Encounter: Payer: Self-pay | Admitting: Family Medicine

## 2024-10-17 ENCOUNTER — Ambulatory Visit (INDEPENDENT_AMBULATORY_CARE_PROVIDER_SITE_OTHER): Admitting: Family Medicine

## 2024-10-17 VITALS — BP 132/66 | HR 75 | Temp 98.3°F | Resp 18 | Ht 64.0 in | Wt 224.2 lb

## 2024-10-17 DIAGNOSIS — Z6838 Body mass index (BMI) 38.0-38.9, adult: Secondary | ICD-10-CM | POA: Diagnosis not present

## 2024-10-17 DIAGNOSIS — E66812 Obesity, class 2: Secondary | ICD-10-CM

## 2024-10-17 DIAGNOSIS — E6609 Other obesity due to excess calories: Secondary | ICD-10-CM | POA: Diagnosis not present

## 2024-10-17 NOTE — Patient Instructions (Signed)
 Schedule your complete physical in April Continue the phentermine daily Keep up the good work on [employer] and regular exercise- you're doing great! Call with any questions or concerns Stay Safe!  Stay Healthy! Happy Holidays!!!

## 2024-10-17 NOTE — Assessment & Plan Note (Signed)
 Ongoing issue.  Down 6 lbs since starting Phentermine.  Currently asymptomatic.  Denies palpitations, insomnia.  Now has basketball daily and is getting plenty of exercise.  Finish course of Phentermine.  Will continue to follow.

## 2024-10-17 NOTE — Progress Notes (Signed)
   Subjective:    Patient ID: Crecencio Cristopher Saba, female    DOB: [birth year], 48 y.o.   MRN: [id number]  HPI Obesity- pt started on Phentermine last visit.  Down 6 lbs.  Pt feels good.  Is now ref'ing daily and running up and down the court.  Plenty of exercise.  Denies palpitations, insomnia   Review of Systems For ROS see HPI     Objective:   Physical Exam Vitals reviewed.  Constitutional:      General: She is not in acute distress.    Appearance: Normal appearance. She is not ill-appearing.  HENT:     Head: Normocephalic and atraumatic.  Eyes:     Extraocular Movements: Extraocular movements intact.     Conjunctiva/sclera: Conjunctivae normal.  Cardiovascular:     Rate and Rhythm: Normal rate.     Pulses: Normal pulses.  Pulmonary:     Effort: Pulmonary effort is normal.  Skin:    General: Skin is warm and dry.  Neurological:     General: No focal deficit present.     Mental Status: She is alert and oriented to person, place, and time.  Psychiatric:        Mood and Affect: Mood normal.        Behavior: Behavior normal.        Thought Content: Thought content normal.           Assessment & Plan:

## 2024-11-06 ENCOUNTER — Other Ambulatory Visit (HOSPITAL_COMMUNITY): Payer: Self-pay

## 2024-12-04 ENCOUNTER — Other Ambulatory Visit: Payer: Self-pay | Admitting: Family Medicine

## 2024-12-05 NOTE — Telephone Encounter (Signed)
 Requested Prescriptions   Pending Prescriptions Disp Refills   phentermine  (ADIPEX-P ) 37.5 MG tablet [Pharmacy Med Name: PHENTERMINE  37.5 MG TABLET] 30 tablet 0    Sig: TAKE 1 TABLET BY MOUTH DAILY BEFORE BREAKFAST     Date of patient request: 12/06/23 Last office visit: 10/17/2024 Upcoming visit: 03/19/2025 Date of last refill: 09/08/24 Last refill amount: 30

## 2025-03-19 ENCOUNTER — Encounter: Admitting: Family Medicine
